# Patient Record
Sex: Female | Born: 1985 | Race: Black or African American | Hispanic: No | Marital: Married | Smoking: Never smoker
Health system: Southern US, Community
[De-identification: ages and names within clinical notes are randomized; demographics above are authoritative.]

## PROBLEM LIST (undated history)

## (undated) DIAGNOSIS — I10 Essential (primary) hypertension: Secondary | ICD-10-CM

## (undated) HISTORY — DX: Essential (primary) hypertension: I10

---

## 2008-04-21 ENCOUNTER — Other Ambulatory Visit: Admission: RE | Admit: 2008-04-21 | Discharge: 2008-04-21 | Payer: Self-pay | Admitting: Obstetrics and Gynecology

## 2008-12-19 ENCOUNTER — Inpatient Hospital Stay (HOSPITAL_COMMUNITY): Admission: AD | Admit: 2008-12-19 | Discharge: 2008-12-21 | Payer: Self-pay | Admitting: Obstetrics & Gynecology

## 2008-12-19 ENCOUNTER — Ambulatory Visit: Payer: Self-pay | Admitting: Obstetrics and Gynecology

## 2010-09-05 LAB — CBC
MCHC: 32.8 g/dL (ref 30.0–36.0)
MCV: 91.9 fL (ref 78.0–100.0)
RDW: 14.5 % (ref 11.5–15.5)

## 2010-09-05 LAB — RPR: RPR Ser Ql: NONREACTIVE

## 2012-07-16 ENCOUNTER — Encounter: Payer: Self-pay | Admitting: *Deleted

## 2012-08-30 ENCOUNTER — Other Ambulatory Visit: Payer: Self-pay | Admitting: *Deleted

## 2012-08-30 ENCOUNTER — Telehealth: Payer: Self-pay | Admitting: *Deleted

## 2012-08-30 ENCOUNTER — Encounter: Payer: Self-pay | Admitting: Obstetrics & Gynecology

## 2012-08-30 ENCOUNTER — Ambulatory Visit (INDEPENDENT_AMBULATORY_CARE_PROVIDER_SITE_OTHER): Payer: Medicaid Other | Admitting: Obstetrics & Gynecology

## 2012-08-30 NOTE — Patient Instructions (Signed)
Laparoscopic Tubal Ligation Laparoscopic tubal ligation is a procedure that closes the fallopian tubes at a time other than right after childbirth. By closing the fallopian tubes, the eggs that are released from the ovaries cannot enter the uterus and sperm cannot reach the egg. Tubal ligation is also known as getting your "tubes tied." Tubal ligation is done so you will not be able to get pregnant or have a baby.  Although this procedure may be reversed, it should be considered permanent and irreversible. If you want to have future pregnancies, you should not have this procedure.  LET YOUR CAREGIVER KNOW ABOUT:  Allergies to food or medicine.  Medicines taken, including vitamins, herbs, eyedrops, over-the-counter medicines, and creams.  Use of steroids (by mouth or creams).  Previous problems with numbing medicines.  History of bleeding problems or blood clots.  Any recent colds or infections.  Previous surgery.  Other health problems, including diabetes and kidney problems.  Possibility of pregnancy, if this applies.  Any past pregnancies. RISKS AND COMPLICATIONS   Infection.  Bleeding.  Injury to surrounding organs.  Anesthetic side effects.  Failure of the procedure.  Ectopic pregnancy.  Future regret about having the procedure done. BEFORE THE PROCEDURE  Do not take aspirin or blood thinners a week before the procedure or as directed. This can cause bleeding.  Do not eat or drink anything 6 to 8 hours before the procedure. PROCEDURE   You may be given a medicine to help you relax (sedative) before the procedure. You will be given a medicine to make you sleep (general anesthetic) during the procedure.  A tube will be put down your throat to help your breath while under general anesthesia.  Two small cuts (incisions) are made in the lower abdominal area and near the belly button.  Your abdominal area will be inflated with a safe gas (carbon dioxide). This helps  give the surgeon room to operate, visualize, and helps the surgeon avoid other organs.  A thin, lighted tube (laparoscope) with a camera attached is inserted into your abdomen through one of the incisions near the belly button. Other small instruments are also inserted through the other abdominal incision.  The fallopian tubes are located and are either blocked with a ring, clip, or are burned (cauterized).  After the fallopian tubes are blocked, the gas is released from the abdomen.  The incisions will be closed with stitches (sutures), and a bandage may be placed over the incisions. AFTER THE PROCEDURE   You will rest in a recovery room for 1 4 hours until you are stable and doing well.  You will also have some mild abdominal discomfort for 3 7 days. You will be given pain medicine to ease any discomfort.  As long as there are no problems, you may be allowed to go home. Someone will need to drive you home and be with you for at least 24 hours once home.  You may have some mild discomfort in the throat. This is from the tube placed in your throat while you were sleeping.  You may experience discomfort in the shoulder area from some trapped air between the liver and diaphragm. This sensation is normal and will slowly go away on its own. Document Released: 08/22/2000 Document Revised: 11/15/2011 Document Reviewed: 08/27/2011 [employer] Patient Information 2013 [employer].  

## 2012-08-30 NOTE — Progress Notes (Signed)
Patient ID: Terri Reynolds, female   DOB: 1985-09-26, 27 y.o.   MRN: [id number] Subjective:     Terri Reynolds is a 27 y.o. female who presents for a postpartum visit. She is 6 weeks postpartum following a spontaneous vaginal delivery. I have fully reviewed the prenatal and intrapartum course. The delivery was at 37 gestational weeks. Outcome: spontaneous vaginal delivery. Anesthesia: none. Postpartum course has been unremarkable. Baby's course has been unremarkable. Baby is feeding by bottle - [employer] Good Start. Bleeding no bleeding. Bowel function is normal. Bladder function is normal. Patient is not sexually active. Contraception method is desires  tubal ligation. Postpartum depression screening: negative.  The following portions of the patient's history were reviewed and updated as appropriate: allergies, current medications, past family history, past medical history, past social history, past surgical history and problem list.  Review of Systems Pertinent items are noted in HPI.   Objective:    BP 108/68  Ht 5\' 6"  (1.676 m)  Wt 256 lb (116.121 kg)  BMI 41.34 kg/m2  LMP 08/22/2012  General:  alert, cooperative and no distress   Breasts:  inspection negative, no nipple discharge or bleeding, no masses or nodularity palpable  Lungs: clear to auscultation bilaterally  Heart:  regular rate and rhythm, S1, S2 normal, no murmur, click, rub or gallop  Abdomen: soft, non-tender; bowel sounds normal; no masses,  no organomegaly   Vulva:  normal  Vagina: not evaluated  Cervix:  no lesions  Corpus: normal size, contour, position, consistency, mobility, non-tender  Adnexa:  normal adnexa  Rectal Exam: Normal rectovaginal exam        Assessment:     normal postpartum exam. Pap smear not done at today's visit.   Plan:    1. Contraception:  desires tubal ligation 2. btl 09/19/2012 3. Follow up in: 4 weeks or as needed.

## 2012-08-30 NOTE — Telephone Encounter (Signed)
Pt states returning Call regarding pre-op at [facility]. Informed pt of date and time of pre-op labs at [facility] ( 09/13/2012 @3 :15)

## 2012-08-31 ENCOUNTER — Encounter (HOSPITAL_COMMUNITY): Payer: Self-pay | Admitting: Pharmacy Technician

## 2012-09-12 ENCOUNTER — Telehealth: Payer: Self-pay | Admitting: Obstetrics & Gynecology

## 2012-09-12 NOTE — Patient Instructions (Addendum)
CAMIKA MARSICO  09/12/2012   Your procedure is scheduled on:   09/19/2012  Report to [facility] at  830  AM.  Call this number if you have problems the morning of surgery: [phone]   Remember:   Do not eat food or drink liquids after midnight.   Take these medicines the morning of surgery with A SIP OF WATER: none   Do not wear jewelry, make-up or nail polish.  Do not wear lotions, powders, or perfumes.   Do not shave 48 hours prior to surgery. Men may shave face and neck.  Do not bring valuables to the hospital.  Contacts, dentures or bridgework may not be worn into surgery.  Leave suitcase in the car. After surgery it may be brought to your room.  For patients admitted to the hospital, checkout time is 11:00 AM the day of discharge.   Patients discharged the day of surgery will not be allowed to drive  home.  Name and phone number of your driver: family  Special Instructions: Shower using CHG 2 nights before surgery and the night before surgery.  If you shower the day of surgery use CHG.  Use special wash - you have one bottle of CHG for all showers.  You should use approximately 1/3 of the bottle for each shower.   Please read over the following fact sheets that you were given: Pain Booklet, Coughing and Deep Breathing, MRSA Information, Surgical Site Infection Prevention, Anesthesia Post-op Instructions and Care and Recovery After Surgery Tubal Ligation Care After These instructions give you information on caring for yourself after your procedure. Your doctor may also give you more specific instructions. Call your doctor if you have any problems or questions after your procedure. HOME CARE   Do not drink alcohol for 24 hours.   Do not drive or use public transportation for 24 hours.   Do not sign important papers for 24 hours.   Have someone with you for 24 hours.   Keep eating and doing activities as normal.   Change any bandages (dressings) as told by your doctor.    Do not douche or have sex (intercourse) until your doctor says it is okay.  GET HELP RIGHT AWAY IF:   You have a rash.   You have trouble breathing.   You have a reaction or have side effects from your medicines.   You have redness, puffiness (swelling), or more pain in the wound area.   You have yellowish-white fluid (pus) coming from the wound or bandage.   You have a fever.   You have a bad smell coming from your wound or bandage.   Your wound opens up after your stiches (sutures) or staples are removed.   You have more belly (abdominal) pain.   You have a lot of bleeding from the vagina.   You have pain in your shoulders and chest.   You feel dizzy or pass out (faint).   You keep feeling sick to your stomach (nauseous) or throw up (vomit).  MAKE SURE YOU:   Understand these instructions.   Will watch this condition.   Will get help right away if you are not doing well or get worse.  Document Released: 02/23/2008 Document Revised: 05/05/2011 Document Reviewed: 02/23/2008 [facility] Patient Information 2012 [place], [place].PATIENT INSTRUCTIONS POST-ANESTHESIA  IMMEDIATELY FOLLOWING SURGERY:  Do not drive or operate machinery for the first twenty four hours after surgery.  Do not make any important decisions  for twenty four hours after surgery or while taking narcotic pain medications or sedatives.  If you develop intractable nausea and vomiting or a severe headache please notify your doctor immediately.  FOLLOW-UP:  Please make an appointment with your surgeon as instructed. You do not need to follow up with anesthesia unless specifically instructed to do so.  WOUND CARE INSTRUCTIONS (if applicable):  Keep a dry clean dressing on the anesthesia/puncture wound site if there is drainage.  Once the wound has quit draining you may leave it open to air.  Generally you should leave the bandage intact for twenty four hours unless there is drainage.  If the epidural site drains  for more than 36-48 hours please call the anesthesia department.  QUESTIONS?:  Please feel free to call your physician or the hospital operator if you have any questions, and they will be happy to assist you.

## 2012-09-12 NOTE — Telephone Encounter (Signed)
Pt informed will have postop appt with Dr. Despina Hidden after surgery to give clearance to return to work post surgery

## 2012-09-13 ENCOUNTER — Encounter (HOSPITAL_COMMUNITY): Payer: Self-pay

## 2012-09-13 ENCOUNTER — Encounter (HOSPITAL_COMMUNITY)
Admission: RE | Admit: 2012-09-13 | Discharge: 2012-09-13 | Disposition: A | Discharge status: Home / Self Care | Payer: Medicaid Other | Source: Ambulatory Visit | Attending: Obstetrics & Gynecology | Admitting: Obstetrics & Gynecology

## 2012-09-13 ENCOUNTER — Other Ambulatory Visit: Payer: Self-pay | Admitting: Obstetrics & Gynecology

## 2012-09-13 LAB — SURGICAL PCR SCREEN
MRSA, PCR: NEGATIVE
Staphylococcus aureus: NEGATIVE

## 2012-09-13 LAB — URINALYSIS, ROUTINE W REFLEX MICROSCOPIC
Glucose, UA: NEGATIVE mg/dL
Hgb urine dipstick: NEGATIVE
Leukocytes, UA: NEGATIVE
Protein, ur: NEGATIVE mg/dL
pH: 8 (ref 5.0–8.0)

## 2012-09-13 LAB — COMPREHENSIVE METABOLIC PANEL
AST: 13 U/L (ref 0–37)
Albumin: 3.3 g/dL — ABNORMAL LOW (ref 3.5–5.2)
Chloride: 103 mEq/L (ref 96–112)
Creatinine, Ser: 0.76 mg/dL (ref 0.50–1.10)
Potassium: 3.8 mEq/L (ref 3.5–5.1)
Total Bilirubin: 0.7 mg/dL (ref 0.3–1.2)
Total Protein: 7.1 g/dL (ref 6.0–8.3)

## 2012-09-13 LAB — CBC
MCHC: 32.1 g/dL (ref 30.0–36.0)
MCV: 86.1 fL (ref 78.0–100.0)
Platelets: 344 10*3/uL (ref 150–400)
RDW: 13.8 % (ref 11.5–15.5)
WBC: 7 10*3/uL (ref 4.0–10.5)

## 2012-09-13 LAB — HCG, QUANTITATIVE, PREGNANCY: hCG, Beta Chain, Quant, S: <1 m[IU]/mL (ref <5)

## 2012-09-19 ENCOUNTER — Ambulatory Visit (HOSPITAL_COMMUNITY)
Admission: RE | Admit: 2012-09-19 | Discharge: 2012-09-19 | Disposition: A | Discharge status: Home / Self Care | Payer: Medicaid Other | Source: Ambulatory Visit | Attending: Obstetrics & Gynecology | Admitting: Obstetrics & Gynecology

## 2012-09-19 ENCOUNTER — Encounter (HOSPITAL_COMMUNITY): Payer: Self-pay | Admitting: *Deleted

## 2012-09-19 ENCOUNTER — Encounter (HOSPITAL_COMMUNITY): Payer: Self-pay | Admitting: Anesthesiology

## 2012-09-19 ENCOUNTER — Ambulatory Visit (HOSPITAL_COMMUNITY): Payer: Medicaid Other | Admitting: Anesthesiology

## 2012-09-19 ENCOUNTER — Encounter (HOSPITAL_COMMUNITY)
Admission: RE | Disposition: A | Discharge status: Home / Self Care | Payer: Self-pay | Source: Ambulatory Visit | Attending: Obstetrics & Gynecology

## 2012-09-19 DIAGNOSIS — Z9889 Other specified postprocedural states: Secondary | ICD-10-CM

## 2012-09-19 DIAGNOSIS — Z302 Encounter for sterilization: Secondary | ICD-10-CM

## 2012-09-19 DIAGNOSIS — Z6841 Body Mass Index (BMI) 40.0 and over, adult: Secondary | ICD-10-CM | POA: Insufficient documentation

## 2012-09-19 HISTORY — PX: LAPAROSCOPIC TUBAL LIGATION: SHX1937

## 2012-09-19 SURGERY — LIGATION, FALLOPIAN TUBE, LAPAROSCOPIC
Anesthesia: General | Site: Abdomen | Laterality: Bilateral | Wound class: Clean

## 2012-09-19 MED ORDER — CEFAZOLIN SODIUM-DEXTROSE 2-3 GM-% IV SOLR: 2.0000 g | INTRAVENOUS | Status: DC

## 2012-09-19 MED ORDER — ROCURONIUM BROMIDE 50 MG/5ML IV SOLN
INTRAVENOUS | Status: AC
  Filled 2012-09-19: qty 1

## 2012-09-19 MED ORDER — ONDANSETRON HCL 4 MG/2ML IJ SOLN
4.0000 mg | Freq: Once | INTRAMUSCULAR | Status: AC
Start: 2012-09-19 — End: 2012-09-19
  Administered 2012-09-19: 4 mg via INTRAVENOUS

## 2012-09-19 MED ORDER — FENTANYL CITRATE 0.05 MG/ML IJ SOLN: 25.0000 ug | INTRAMUSCULAR | Status: DC | PRN

## 2012-09-19 MED ORDER — PROPOFOL 10 MG/ML IV EMUL
INTRAVENOUS | Status: DC | PRN
  Administered 2012-09-19: 150 mg via INTRAVENOUS

## 2012-09-19 MED ORDER — FENTANYL CITRATE 0.05 MG/ML IJ SOLN
INTRAMUSCULAR | Status: AC
  Filled 2012-09-19: qty 2

## 2012-09-19 MED ORDER — CEFAZOLIN SODIUM-DEXTROSE 2-3 GM-% IV SOLR
INTRAVENOUS | Status: DC | PRN
  Administered 2012-09-19: 2 g via INTRAVENOUS

## 2012-09-19 MED ORDER — 0.9 % SODIUM CHLORIDE (POUR BTL) OPTIME
TOPICAL | Status: DC | PRN
  Administered 2012-09-19: 1000 mL

## 2012-09-19 MED ORDER — LIDOCAINE HCL (CARDIAC) 10 MG/ML IV SOLN
INTRAVENOUS | Status: DC | PRN
  Administered 2012-09-19: 50 mg via INTRAVENOUS

## 2012-09-19 MED ORDER — ONDANSETRON HCL 4 MG/2ML IJ SOLN
INTRAMUSCULAR | Status: AC
  Filled 2012-09-19: qty 2

## 2012-09-19 MED ORDER — CEFAZOLIN SODIUM-DEXTROSE 2-3 GM-% IV SOLR
INTRAVENOUS | Status: AC
  Filled 2012-09-19: qty 50

## 2012-09-19 MED ORDER — LACTATED RINGERS IV SOLN
INTRAVENOUS | Status: DC
  Administered 2012-09-19: 1000 mL via INTRAVENOUS

## 2012-09-19 MED ORDER — ONDANSETRON HCL 8 MG PO TABS: 8.0000 mg | ORAL_TABLET | Freq: Three times a day (TID) | ORAL | Status: DC | PRN

## 2012-09-19 MED ORDER — ROCURONIUM BROMIDE 100 MG/10ML IV SOLN
INTRAVENOUS | Status: DC | PRN
  Administered 2012-09-19: 10 mg via INTRAVENOUS
  Administered 2012-09-19: 30 mg via INTRAVENOUS

## 2012-09-19 MED ORDER — MIDAZOLAM HCL 2 MG/2ML IJ SOLN
1.0000 mg | INTRAMUSCULAR | Status: DC | PRN
  Administered 2012-09-19 (×2): 2 mg via INTRAVENOUS

## 2012-09-19 MED ORDER — MIDAZOLAM HCL 2 MG/2ML IJ SOLN
INTRAMUSCULAR | Status: AC
  Filled 2012-09-19: qty 2

## 2012-09-19 MED ORDER — GLYCOPYRROLATE 0.2 MG/ML IJ SOLN
INTRAMUSCULAR | Status: DC | PRN
  Administered 2012-09-19: 0.6 mg via INTRAVENOUS

## 2012-09-19 MED ORDER — PROPOFOL 10 MG/ML IV EMUL
INTRAVENOUS | Status: AC
  Filled 2012-09-19: qty 20

## 2012-09-19 MED ORDER — KETOROLAC TROMETHAMINE 10 MG PO TABS: 10.0000 mg | ORAL_TABLET | Freq: Three times a day (TID) | ORAL | Status: DC | PRN

## 2012-09-19 MED ORDER — LACTATED RINGERS IV SOLN
INTRAVENOUS | Status: DC | PRN
  Administered 2012-09-19 (×2): via INTRAVENOUS

## 2012-09-19 MED ORDER — LIDOCAINE HCL (PF) 1 % IJ SOLN
INTRAMUSCULAR | Status: AC
  Filled 2012-09-19: qty 5

## 2012-09-19 MED ORDER — KETOROLAC TROMETHAMINE 30 MG/ML IJ SOLN
30.0000 mg | Freq: Once | INTRAMUSCULAR | Status: AC
  Administered 2012-09-19: 30 mg via INTRAVENOUS

## 2012-09-19 MED ORDER — ONDANSETRON HCL 4 MG/2ML IJ SOLN
4.0000 mg | Freq: Once | INTRAMUSCULAR | Status: AC | PRN
  Administered 2012-09-19: 4 mg via INTRAVENOUS

## 2012-09-19 MED ORDER — FENTANYL CITRATE 0.05 MG/ML IJ SOLN
INTRAMUSCULAR | Status: DC | PRN
  Administered 2012-09-19 (×2): 50 ug via INTRAVENOUS
  Administered 2012-09-19: 100 ug via INTRAVENOUS

## 2012-09-19 MED ORDER — NEOSTIGMINE METHYLSULFATE 1 MG/ML IJ SOLN
INTRAMUSCULAR | Status: DC | PRN
  Administered 2012-09-19: 1 mg via INTRAVENOUS
  Administered 2012-09-19: 4 mg via INTRAVENOUS
  Administered 2012-09-19: 1 mg via INTRAVENOUS

## 2012-09-19 MED ORDER — HYDROCODONE-ACETAMINOPHEN 5-325 MG PO TABS: 1.0000 | ORAL_TABLET | Freq: Four times a day (QID) | ORAL | Status: DC | PRN

## 2012-09-19 MED ORDER — KETOROLAC TROMETHAMINE 30 MG/ML IJ SOLN
INTRAMUSCULAR | Status: AC
  Filled 2012-09-19: qty 1

## 2012-09-19 SURGICAL SUPPLY — 32 items
APPLICATOR COTTON TIP 6IN STRL (MISCELLANEOUS) ×2 IMPLANT
BAG HAMPER (MISCELLANEOUS) ×2 IMPLANT
BLADE SURG SZ11 CARB STEEL (BLADE) ×2 IMPLANT
CLOTH BEACON ORANGE TIMEOUT ST (SAFETY) ×2 IMPLANT
COVER LIGHT HANDLE STERIS (MISCELLANEOUS) ×4 IMPLANT
ELECT REM PT RETURN 9FT ADLT (ELECTROSURGICAL) ×2
ELECTRODE REM PT RTRN 9FT ADLT (ELECTROSURGICAL) ×1 IMPLANT
GLOVE BIOGEL PI IND STRL 7.0 (GLOVE) ×1 IMPLANT
GLOVE BIOGEL PI IND STRL 7.5 (GLOVE) ×1 IMPLANT
GLOVE BIOGEL PI IND STRL 8 (GLOVE) ×1 IMPLANT
GLOVE BIOGEL PI INDICATOR 7.0 (GLOVE) ×1
GLOVE BIOGEL PI INDICATOR 7.5 (GLOVE) ×1
GLOVE BIOGEL PI INDICATOR 8 (GLOVE) ×1
GLOVE ECLIPSE 7.0 STRL STRAW (GLOVE) ×2 IMPLANT
GLOVE ECLIPSE 8.0 STRL XLNG CF (GLOVE) ×2 IMPLANT
GOWN STRL REIN XL XLG (GOWN DISPOSABLE) ×2 IMPLANT
INST SET LAPROSCOPIC GYN AP (KITS) ×2 IMPLANT
KIT ROOM TURNOVER AP CYSTO (KITS) ×2 IMPLANT
MANIFOLD NEPTUNE II (INSTRUMENTS) ×2 IMPLANT
NEEDLE INSUFFLATION 14GA 120MM (NEEDLE) ×2 IMPLANT
PACK PERI GYN (CUSTOM PROCEDURE TRAY) ×2 IMPLANT
PAD ARMBOARD 7.5X6 YLW CONV (MISCELLANEOUS) ×2 IMPLANT
SET BASIN LINEN APH (SET/KITS/TRAYS/PACK) ×2 IMPLANT
SOLUTION ANTI FOG 6CC (MISCELLANEOUS) ×2 IMPLANT
SPONGE GAUZE 2X2 8PLY STRL LF (GAUZE/BANDAGES/DRESSINGS) ×2 IMPLANT
STAPLER VISISTAT 35W (STAPLE) ×2 IMPLANT
SUT VICRYL 0 UR6 27IN ABS (SUTURE) ×2 IMPLANT
SYRINGE 10CC LL (SYRINGE) ×2 IMPLANT
TAPE CLOTH SURG 4X10 WHT LF (GAUZE/BANDAGES/DRESSINGS) ×2 IMPLANT
TROCAR Z-THRD FIOS HNDL 11X100 (TROCAR) ×2 IMPLANT
TUBING INSUFFLATION HIGH FLOW (TUBING) ×2 IMPLANT
WARMER LAPAROSCOPE (MISCELLANEOUS) ×2 IMPLANT

## 2012-09-19 NOTE — Anesthesia Postprocedure Evaluation (Signed)
  Anesthesia Post-op Note  Patient: Terri Reynolds  Procedure(s) Performed: Procedure(s): LAPAROSCOPIC TUBAL LIGATION (Bilateral)  Patient Location: PACU  Anesthesia Type:General  Level of Consciousness: awake, alert , oriented and patient cooperative  Airway and Oxygen Therapy: Patient Spontanous Breathing and Patient connected to face mask oxygen  Post-op Pain: mild  Post-op Assessment: Post-op Vital signs reviewed, Patient's Cardiovascular Status Stable, Respiratory Function Stable, Patent Airway and Pain level controlled  Post-op Vital Signs: Reviewed and stable  Complications: No apparent anesthesia complications

## 2012-09-19 NOTE — Transfer of Care (Signed)
Immediate Anesthesia Transfer of Care Note  Patient: Terri Reynolds  Procedure(s) Performed: Procedure(s): LAPAROSCOPIC TUBAL LIGATION (Bilateral)  Patient Location: PACU  Anesthesia Type:General  Level of Consciousness: awake, alert , oriented and patient cooperative  Airway & Oxygen Therapy: Patient Spontanous Breathing and Patient connected to face mask oxygen  Post-op Assessment: Report given to PACU RN, Post -op Vital signs reviewed and stable and Patient moving all extremities  Post vital signs: Reviewed and stable  Complications: No apparent anesthesia complications

## 2012-09-19 NOTE — Anesthesia Preprocedure Evaluation (Signed)
Anesthesia Evaluation  Patient identified by MRN, date of birth, ID band Patient awake    Reviewed: Allergy & Precautions, H&P , NPO status , Patient's Chart, lab work & pertinent test results  Airway Mallampati: I TM Distance: >3 FB     Dental  (+) Teeth Intact   Pulmonary neg pulmonary ROS,  breath sounds clear to auscultation        Cardiovascular negative cardio ROS  Rhythm:Regular     Neuro/Psych    GI/Hepatic negative GI ROS,   Endo/Other  Morbid obesity  Renal/GU      Musculoskeletal   Abdominal   Peds  Hematology   Anesthesia Other Findings   Reproductive/Obstetrics                           Anesthesia Physical Anesthesia Plan  ASA: II  Anesthesia Plan: General   Post-op Pain Management:    Induction: Intravenous  Airway Management Planned: Oral ETT  Additional Equipment:   Intra-op Plan:   Post-operative Plan: Extubation in OR  Informed Consent: I have reviewed the patients History and Physical, chart, labs and discussed the procedure including the risks, benefits and alternatives for the proposed anesthesia with the patient or authorized representative who has indicated his/her understanding and acceptance.     Plan Discussed with:   Anesthesia Plan Comments:         Anesthesia Quick Evaluation

## 2012-09-19 NOTE — Op Note (Signed)
Preoperative Diagnosis:  Multiparous female desires permanent sterilization  Postoperative Diagnosis:  Same as above  Procedure:  Laparoscopic Bilateral Tubal Ligation  Surgeon:  Rockne Coons MD  Anaesthesia:  LMA  Findings:  Patient had normal pelvic anatomy and no intraperitoneal abnormalities.  Description of Operation:  Patient was taken to the OR and placed into supine position where she underwent LMA anaesthesia.  She was placed in the dorsal lithotomy position and prepped and draped in the usual sterile fashion.  An incision was made in the umbilicus and dissection taken down to the rectus fascia which was incised and opened.  The non bladed trocar was then placed and the peritoneal cavity was insufflated.  The above noted findings were observed.  The Kleppinger electrocautery unit was employed and both tubes were burned to no resistance and beyond in the distal ishtmic and ampullary regions, approximately a 3.5 cm segment bilaterally.  There was good hemostasis.  The fascia, peritoneum and subcutaneous tissue were closed using 0 vicryl.  The skin was closed using staples.  The patient was awakened from anaesthesia and taken to the PACU with all counts being correct x 3.  The patient received Ancef 1 gram and Toradol 30 mg IV preoperatively.  EURE,LUTHER H 09/19/2012 11:35 AM

## 2012-09-19 NOTE — Progress Notes (Signed)
Awake. Eyeglasses to pt.

## 2012-09-19 NOTE — Anesthesia Procedure Notes (Signed)
Procedure Name: Intubation Date/Time: 09/19/2012 11:01 AM Performed by: Carolyne Littles, AMY L Pre-anesthesia Checklist: Patient identified, Patient being monitored, Timeout performed, Emergency Drugs available and Suction available Patient Re-evaluated:Patient Re-evaluated prior to inductionOxygen Delivery Method: Circle System Utilized Preoxygenation: Pre-oxygenation with 100% oxygen Intubation Type: IV induction Ventilation: Mask ventilation without difficulty Laryngoscope Size: 3 and Miller Grade View: Grade I Tube type: Oral Tube size: 7.0 mm Number of attempts: 1 Airway Equipment and Method: stylet Placement Confirmation: ETT inserted through vocal cords under direct vision,  positive ETCO2 and breath sounds checked- equal and bilateral Secured at: 21 cm Tube secured with: Tape Dental Injury: Teeth and Oropharynx as per pre-operative assessment

## 2012-09-19 NOTE — H&P (Signed)
Preoperative History and Physical  Terri Reynolds is a 27 y.o. G2P0 with Patient's last menstrual period was 09/15/2012. admitted for a laparoscopic bilateral tubal ligation.  Patient understands the permanent nature of the procedure  PMH:   History reviewed. No pertinent past medical history.  PSH:    History reviewed. No pertinent past surgical history.  POb/GynH:     G2P2 OB History   Grav Para Term Preterm Abortions TAB SAB Ect Mult Living   2               SH:   History  Substance Use Topics  . Smoking status: Never Smoker   . Smokeless tobacco: Not on file  . Alcohol Use: Yes     Comment: occasional drink once a month    FH:    Family History  Problem Relation Age of Onset  . Diabetes Maternal Grandmother      Allergies: No Known Allergies  Medications:      Current facility-administered medications:ceFAZolin (ANCEF) IVPB 2 g/50 mL premix, 2 g, Intravenous, On Call to OR, Lazaro Arms, MD;  lactated ringers infusion, , Intravenous, Continuous, Laurene Footman, MD, Last Rate: 75 mL/hr at 09/19/12 1019, 1,000 mL at 09/19/12 1019;  midazolam (VERSED) injection 1-2 mg, 1-2 mg, Intravenous, Q5 Min x 3 PRN, Laurene Footman, MD, 2 mg at 09/19/12 1031 Facility-Administered Medications Ordered in Other Encounters: lactated ringers infusion, , , Continuous PRN, Amy L Andraza, CRNA  Review of Systems:   Review of Systems  Constitutional: Negative for fever, chills, weight loss, malaise/fatigue and diaphoresis.  HENT: Negative for hearing loss, ear pain, nosebleeds, congestion, sore throat, neck pain, tinnitus and ear discharge.   Eyes: Negative for blurred vision, double vision, photophobia, pain, discharge and redness.  Respiratory: Negative for cough, hemoptysis, sputum production, shortness of breath, wheezing and stridor.   Cardiovascular: Negative for chest pain, palpitations, orthopnea, claudication, leg swelling and PND.  Gastrointestinal: Negative for abdominal pain.  Negative for heartburn, nausea, vomiting, diarrhea, constipation, blood in stool and melena.  Genitourinary: Negative for dysuria, urgency, frequency, hematuria and flank pain.  Musculoskeletal: Negative for myalgias, back pain, joint pain and falls.  Skin: Negative for itching and rash.  Neurological: Negative for dizziness, tingling, tremors, sensory change, speech change, focal weakness, seizures, loss of consciousness, weakness and headaches.  Endo/Heme/Allergies: Negative for environmental allergies and polydipsia. Does not bruise/bleed easily.  Psychiatric/Behavioral: Negative for depression, suicidal ideas, hallucinations, memory loss and substance abuse. The patient is not nervous/anxious and does not have insomnia.      PHYSICAL EXAM:  Blood pressure 123/80, pulse 60, temperature 97.8 F (36.6 C), temperature source Oral, resp. rate 21, height 5\' 6"  (1.676 m), weight 258 lb (117.028 kg), last menstrual period 09/15/2012, SpO2 99.00%.    Vitals reviewed. Constitutional: She is oriented to person, place, and time. She appears well-developed and well-nourished.  HENT:  Head: Normocephalic and atraumatic.  Right Ear: External ear normal.  Left Ear: External ear normal.  Nose: Nose normal.  Mouth/Throat: Oropharynx is clear and moist.  Eyes: Conjunctivae and EOM are normal. Pupils are equal, round, and reactive to light. Right eye exhibits no discharge. Left eye exhibits no discharge. No scleral icterus.  Neck: Normal range of motion. Neck supple. No tracheal deviation present. No thyromegaly present.  Cardiovascular: Normal rate, regular rhythm, normal heart sounds and intact distal pulses.  Exam reveals no gallop and no friction rub.   No murmur heard. Respiratory: Effort normal and breath sounds normal.  No respiratory distress. She has no wheezes. She has no rales. She exhibits no tenderness.  GI: Soft. Bowel sounds are normal. She exhibits no distension and no mass. There is  tenderness. There is no rebound and no guarding.  Genitourinary:       Vulva is normal without lesions Vagina is pink moist without discharge Cervix normal in appearance and pap is normal Uterus is normal Adnexa is negative with normal sized ovaries by sonogram  Musculoskeletal: Normal range of motion. She exhibits no edema and no tenderness.  Neurological: She is alert and oriented to person, place, and time. She has normal reflexes. She displays normal reflexes. No cranial nerve deficit. She exhibits normal muscle tone. Coordination normal.  Skin: Skin is warm and dry. No rash noted. No erythema. No pallor.  Psychiatric: She has a normal mood and affect. Her behavior is normal. Judgment and thought content normal.    Labs: Results for orders placed during the hospital encounter of 09/13/12 (from the past 336 hour(s))  SURGICAL PCR SCREEN   Collection Time    09/13/12  8:45 AM      Result Value Range   MRSA, PCR NEGATIVE  NEGATIVE   Staphylococcus aureus NEGATIVE  NEGATIVE  CBC   Collection Time    09/13/12  8:45 AM      Result Value Range   WBC 7.0  4.0 - 10.5 K/uL   RBC 4.31  3.87 - 5.11 MIL/uL   Hemoglobin 11.9 (*) 12.0 - 15.0 g/dL   HCT 78.4  69.6 - 29.5 %   MCV 86.1  78.0 - 100.0 fL   MCH 27.6  26.0 - 34.0 pg   MCHC 32.1  30.0 - 36.0 g/dL   RDW 28.4  13.2 - 44.0 %   Platelets 344  150 - 400 K/uL  COMPREHENSIVE METABOLIC PANEL   Collection Time    09/13/12  8:45 AM      Result Value Range   Sodium 135  135 - 145 mEq/L   Potassium 3.8  3.5 - 5.1 mEq/L   Chloride 103  96 - 112 mEq/L   CO2 22  19 - 32 mEq/L   Glucose, Bld 84  70 - 99 mg/dL   BUN 5 (*) 6 - 23 mg/dL   Creatinine, Ser 1.02  0.50 - 1.10 mg/dL   Calcium 8.9  8.4 - 72.5 mg/dL   Total Protein 7.1  6.0 - 8.3 g/dL   Albumin 3.3 (*) 3.5 - 5.2 g/dL   AST 13  0 - 37 U/L   ALT 8  0 - 35 U/L   Alkaline Phosphatase 80  39 - 117 U/L   Total Bilirubin 0.7  0.3 - 1.2 mg/dL   GFR calc non Af Amer >90  >90 mL/min    GFR calc Af Amer >90  >90 mL/min  URINALYSIS, ROUTINE W REFLEX MICROSCOPIC   Collection Time    09/13/12  8:45 AM      Result Value Range   Color, Urine YELLOW  YELLOW   APPearance CLEAR  CLEAR   Specific Gravity, Urine 1.020  1.005 - 1.030   pH 8.0  5.0 - 8.0   Glucose, UA NEGATIVE  NEGATIVE mg/dL   Hgb urine dipstick NEGATIVE  NEGATIVE   Bilirubin Urine NEGATIVE  NEGATIVE   Ketones, ur NEGATIVE  NEGATIVE mg/dL   Protein, ur NEGATIVE  NEGATIVE mg/dL   Urobilinogen, UA 0.2  0.0 - 1.0 mg/dL   Nitrite NEGATIVE  NEGATIVE  Leukocytes, UA NEGATIVE  NEGATIVE  HCG, QUANTITATIVE, PREGNANCY   Collection Time    09/13/12  8:45 AM      Result Value Range   hCG, Beta Chain, Quant, S <1  <5 mIU/mL    EKG:   Imaging Studies:     Assessment: Desires permanent sterilization  Plan: Laparoscopic bilateral tubal ligation  Island Dohmen H 09/19/2012 10:45 AM

## 2012-09-21 ENCOUNTER — Encounter (HOSPITAL_COMMUNITY): Payer: Self-pay | Admitting: Obstetrics & Gynecology

## 2012-10-01 ENCOUNTER — Encounter: Payer: Medicaid Other | Admitting: Obstetrics & Gynecology

## 2012-10-02 ENCOUNTER — Ambulatory Visit (INDEPENDENT_AMBULATORY_CARE_PROVIDER_SITE_OTHER): Payer: Self-pay | Admitting: Obstetrics & Gynecology

## 2012-10-02 ENCOUNTER — Encounter: Payer: Self-pay | Admitting: Obstetrics & Gynecology

## 2012-10-02 VITALS — BP 112/74 | Ht 67.0 in | Wt 260.0 lb

## 2012-10-02 DIAGNOSIS — Z9889 Other specified postprocedural states: Secondary | ICD-10-CM

## 2012-10-03 NOTE — Progress Notes (Signed)
Patient ID: Terri Reynolds, female   DOB: 1985-10-10, 27 y.o.   MRN: [id number] Post operative lap btl No complaints Incision clean dry intact Staples removed  Follow up prn or for yearly

## 2014-03-31 ENCOUNTER — Encounter: Payer: Self-pay | Admitting: Obstetrics & Gynecology

## 2014-06-04 ENCOUNTER — Ambulatory Visit: Payer: Self-pay | Admitting: Adult Health

## 2014-06-11 ENCOUNTER — Ambulatory Visit (INDEPENDENT_AMBULATORY_CARE_PROVIDER_SITE_OTHER): Payer: Self-pay | Admitting: Advanced Practice Midwife

## 2014-06-11 ENCOUNTER — Encounter: Payer: Self-pay | Admitting: Advanced Practice Midwife

## 2014-06-11 ENCOUNTER — Other Ambulatory Visit (HOSPITAL_COMMUNITY)
Admission: RE | Admit: 2014-06-11 | Discharge: 2014-06-11 | Disposition: A | Discharge status: Home / Self Care | Payer: Medicaid Other | Source: Ambulatory Visit | Attending: Obstetrics and Gynecology | Admitting: Obstetrics and Gynecology

## 2014-06-11 VITALS — BP 118/70 | Ht 67.0 in | Wt 277.0 lb

## 2014-06-11 DIAGNOSIS — Z01419 Encounter for gynecological examination (general) (routine) without abnormal findings: Secondary | ICD-10-CM | POA: Insufficient documentation

## 2014-06-11 DIAGNOSIS — Z6841 Body Mass Index (BMI) 40.0 and over, adult: Secondary | ICD-10-CM

## 2014-06-11 NOTE — Progress Notes (Signed)
Terri Reynolds 29 y.o.  Filed Vitals:   06/11/14 1110  BP: 118/70     Past Medical History: History reviewed. No pertinent past medical history.  Past Surgical History: Past Surgical History  Procedure Laterality Date  . Laparoscopic tubal ligation Bilateral 09/19/2012    Procedure: LAPAROSCOPIC TUBAL LIGATION;  Surgeon: Lazaro ArmsLuther H Eure, MD;  Location: [facility];  Service: Gynecology;  Laterality: Bilateral;    Family History: Family History  Problem Relation Age of Onset  . Diabetes Maternal Grandmother   . Hypertension Brother     Social History: History  Substance Use Topics  . Smoking status: Never Smoker   . Smokeless tobacco: Not on file  . Alcohol Use: Yes     Comment: occasional drink once a month    Allergies: No Known Allergies    Current outpatient prescriptions:  .  amoxicillin (AMOXIL) 250 MG capsule, Take 250 mg by mouth 2 (two) times daily., Disp: , Rfl:   History of Present Illness: Here for pap/physical.  Had BTL.  On last few days of amoxicillin for strep throat.  No C/O   Review of Systems   Patient denies any headaches, blurred vision, shortness of breath, chest pain, abdominal pain, problems with bowel movements, urination, or intercourse.   Physical Exam: General:  Well developed, well nourished, no acute distress Skin:  Warm and dry Neck:  Midline trachea, normal thyroid Lungs; Clear to auscultation bilaterally Breast:  No dominant palpable mass, retraction, or nipple discharge Cardiovascular: Regular rate and rhythm Abdomen:  Soft, non tender, no hepatosplenomegaly Pelvic:  External genitalia is normal in appearance.  The vagina is normal in appearance.  The cervix is bulbous.  Uterus is felt to be normal size, shape, and contour, although exam limited by body habitus  No adnexal masses or tenderness noted Extremities:  No swelling or varicosities noted Psych:  No mood changes.     Impression: Normal GYN exam     Plan: CBC, CMP,  TSH, LIPID PANEL when fasting next week

## 2014-06-11 NOTE — Addendum Note (Signed)
Addended by: Criss AlvinePULLIAM, Juanpablo Ciresi G on: 06/11/2014 01:42 PM   Modules accepted: Orders

## 2014-06-13 LAB — CYTOLOGY - PAP

## 2014-06-19 ENCOUNTER — Other Ambulatory Visit: Payer: Self-pay

## 2014-06-19 ENCOUNTER — Encounter: Payer: Self-pay | Admitting: Advanced Practice Midwife

## 2014-06-19 ENCOUNTER — Ambulatory Visit (INDEPENDENT_AMBULATORY_CARE_PROVIDER_SITE_OTHER): Payer: Self-pay | Admitting: Advanced Practice Midwife

## 2014-06-19 VITALS — BP 110/70 | Wt 278.0 lb

## 2014-06-19 DIAGNOSIS — Z13220 Encounter for screening for lipoid disorders: Secondary | ICD-10-CM

## 2014-06-19 DIAGNOSIS — Z1329 Encounter for screening for other suspected endocrine disorder: Secondary | ICD-10-CM

## 2014-06-19 DIAGNOSIS — B372 Candidiasis of skin and nail: Secondary | ICD-10-CM

## 2014-06-19 DIAGNOSIS — Z01419 Encounter for gynecological examination (general) (routine) without abnormal findings: Secondary | ICD-10-CM

## 2014-06-19 LAB — CBC
HCT: 34 % — ABNORMAL LOW (ref 36.0–46.0)
HEMOGLOBIN: 10.8 g/dL — AB (ref 12.0–15.0)
MCH: 26.1 pg (ref 26.0–34.0)
MCHC: 31.8 g/dL (ref 30.0–36.0)
MCV: 82.1 fL (ref 78.0–100.0)
MPV: 9.3 fL (ref 8.6–12.4)
Platelets: 390 10*3/uL (ref 150–400)
RBC: 4.14 MIL/uL (ref 3.87–5.11)
RDW: 15.6 % — AB (ref 11.5–15.5)
WBC: 8.4 10*3/uL (ref 4.0–10.5)

## 2014-06-19 LAB — COMPREHENSIVE METABOLIC PANEL
ALT: 8 U/L (ref 0–35)
AST: 12 U/L (ref 0–37)
Albumin: 3.4 g/dL — ABNORMAL LOW (ref 3.5–5.2)
Alkaline Phosphatase: 69 U/L (ref 39–117)
BUN: 4 mg/dL — ABNORMAL LOW (ref 6–23)
CO2: 26 mEq/L (ref 19–32)
CREATININE: 0.7 mg/dL (ref 0.50–1.10)
Calcium: 8.6 mg/dL (ref 8.4–10.5)
Chloride: 106 mEq/L (ref 96–112)
Glucose, Bld: 79 mg/dL (ref 70–99)
Potassium: 4.1 mEq/L (ref 3.5–5.3)
SODIUM: 138 meq/L (ref 135–145)
Total Bilirubin: 0.8 mg/dL (ref 0.2–1.2)
Total Protein: 6.5 g/dL (ref 6.0–8.3)

## 2014-06-19 LAB — LIPID PANEL
CHOL/HDL RATIO: 2.5 ratio
CHOLESTEROL: 137 mg/dL (ref 0–200)
HDL: 55 mg/dL (ref >39)
LDL Cholesterol: 67 mg/dL (ref 0–99)
TRIGLYCERIDES: 74 mg/dL (ref <150)
VLDL: 15 mg/dL (ref 0–40)

## 2014-06-19 LAB — TSH: TSH: 1.241 u[IU]/mL (ref 0.350–4.500)

## 2014-06-19 MED ORDER — FLUCONAZOLE 150 MG PO TABS: ORAL_TABLET | ORAL | Status: DC

## 2014-06-19 NOTE — Progress Notes (Signed)
[facility] Visit  Patient name: Terri JulyWhitney N Belson MRN [id number]  Date of birth: [birth year]  CC & HPI:  Terri Reynolds is a 29 y.o. African American female presenting today for c/o itchy groin rash.  Had been on several abx.  Also here for screening labs  Pertinent History Reviewed:  Medical & Surgical Hx:   History reviewed. No pertinent past medical history. Past Surgical History  Procedure Laterality Date  . Laparoscopic tubal ligation Bilateral 09/19/2012    Procedure: LAPAROSCOPIC TUBAL LIGATION;  Surgeon: Lazaro ArmsLuther H Eure, MD;  Location: [facility];  Service: Gynecology;  Laterality: Bilateral;    Current outpatient prescriptions:  .  amoxicillin (AMOXIL) 250 MG capsule, Take 250 mg by mouth 2 (two) times daily., Disp: , Rfl:  .  fluconazole (DIFLUCAN) 150 MG tablet, 1 po stat; repeat in 3 days, Disp: 2 tablet, Rfl: 2 Social History: Reviewed -  reports that she has never smoked. She does not have any smokeless tobacco history on file.  Objective Findings:  Vitals: BP 110/70 mmHg  Wt 278 lb (126.1 kg)  LMP 06/03/2014  Physical Examination: General appearance - alert, well appearing, and in no distress Mental status - alert, oriented to person, place, and time Skin - yeast rash on groin.  SSE:  Normal discharge  No results found for this or any previous visit (from the past 24 hour(s)).   Assessment & Plan:  A:   Skin yeast P:  Diflucan  May also use OTC cream CBC, CMP, TSH, Lipid profile   CRESENZO-DISHMAN,Cyndra Feinberg CNM 06/19/2014 9:47 AM

## 2014-12-17 ENCOUNTER — Ambulatory Visit (INDEPENDENT_AMBULATORY_CARE_PROVIDER_SITE_OTHER): Payer: Self-pay | Admitting: Advanced Practice Midwife

## 2014-12-17 ENCOUNTER — Encounter: Payer: Self-pay | Admitting: Advanced Practice Midwife

## 2014-12-17 VITALS — BP 98/58 | HR 64 | Ht 66.0 in | Wt 271.0 lb

## 2014-12-17 DIAGNOSIS — N611 Abscess of the breast and nipple: Secondary | ICD-10-CM

## 2014-12-17 DIAGNOSIS — N61 Inflammatory disorders of breast: Secondary | ICD-10-CM

## 2014-12-17 NOTE — Progress Notes (Signed)
   [facility] Visit  Patient name: Terri Reynolds MRN [id number]  Date of birth: 1986-04-29  CC & HPI:  Terri Reynolds is a 29 y.o. African American female presenting today for c/o lump in right breast.  Noticed it Monday while in the shower.  Was tender to touch on Monday, now it is not. No drainage noted  Pertinent History Reviewed:  Medical & Surgical Hx:   History reviewed. No pertinent past medical history. Past Surgical History  Procedure Laterality Date  . Laparoscopic tubal ligation Bilateral 09/19/2012    Procedure: LAPAROSCOPIC TUBAL LIGATION;  Surgeon: Lazaro ArmsLuther H Eure, MD;  Location: [facility];  Service: Gynecology;  Laterality: Bilateral;   Family History  Problem Relation Age of Onset  . Diabetes Maternal Grandmother   . Hypertension Brother     Current outpatient prescriptions:  .  amoxicillin (AMOXIL) 250 MG capsule, Take 250 mg by mouth 2 (two) times daily., Disp: , Rfl:  .  fluconazole (DIFLUCAN) 150 MG tablet, 1 po stat; repeat in 3 days (Patient not taking: Reported on 12/17/2014), Disp: 2 tablet, Rfl: 2 Social History: Reviewed -  reports that she has never smoked. She has never used smokeless tobacco.  Review of Systems:   Constitutional: Negative for fever and chills Eyes: Negative for visual disturbances Respiratory: Negative for shortness of breath, dyspnea Cardiovascular: Negative for chest pain or palpitations  Gastrointestinal: Negative for vomiting, diarrhea and constipation; no abdominal pain Genitourinary: Negative for dysuria and urgency, vaginal irritation or itching Musculoskeletal: Negative for back pain, joint pain, myalgias  Neurological: Negative for dizziness and headaches    Objective Findings:  Vitals: BP 98/58 mmHg  Pulse 64  Ht 5\' 6"  (1.676 m)  Wt 271 lb (122.925 kg)  BMI 43.76 kg/m2  LMP 12/16/2014  Physical Examination: General appearance - alert, well appearing, and in no distress Mental status - alert, oriented to  person, place, and time Breasts - 1/2 cm furuncle right breast near chest wall.  Sl discolored but not erythemous.  Seems to be absorbing rather than coming to a head.       Assessment & Plan:  A:   Furuncle, resolving P:  No tx needed  CRESENZO-DISHMAN,Mohit Zirbes CNM 12/17/2014 3:51 PM

## 2014-12-17 NOTE — Patient Instructions (Signed)
Furuncle

## 2015-01-26 ENCOUNTER — Encounter: Payer: Self-pay | Admitting: Women's Health

## 2015-01-26 ENCOUNTER — Ambulatory Visit (INDEPENDENT_AMBULATORY_CARE_PROVIDER_SITE_OTHER): Payer: Self-pay | Admitting: Women's Health

## 2015-01-26 VITALS — BP 124/76 | HR 80 | Wt 272.0 lb

## 2015-01-26 DIAGNOSIS — N939 Abnormal uterine and vaginal bleeding, unspecified: Secondary | ICD-10-CM

## 2015-01-26 DIAGNOSIS — N949 Unspecified condition associated with female genital organs and menstrual cycle: Secondary | ICD-10-CM

## 2015-01-26 DIAGNOSIS — N923 Ovulation bleeding: Secondary | ICD-10-CM

## 2015-01-26 DIAGNOSIS — R102 Pelvic and perineal pain: Secondary | ICD-10-CM

## 2015-01-26 LAB — POCT WET PREP (WET MOUNT): CLUE CELLS WET PREP WHIFF POC: NEGATIVE

## 2015-01-26 NOTE — Progress Notes (Signed)
   [facility] Visit  Patient name: Terri Reynolds MRN [id number]  Date of birth: [birth year]  CC & HPI:  Terri Reynolds is a 29 y.o. G2P0 African American female presenting today for report of intermenstrual spotting x 1 and abdominal cramping last week. Both have resolved, she just wanted to make sure everything is ok. Denies abnormal d/c, itching/odor/irritation. Is sexually active. Denies dyspareunia. Up to date on pap.  Patient's last menstrual period was 01/10/2015. The current method of family planning is tubal ligation.  Pertinent History Reviewed:  Medical & Surgical Hx:   History reviewed. No pertinent past medical history. Past Surgical History  Procedure Laterality Date  . Laparoscopic tubal ligation Bilateral 09/19/2012    Procedure: LAPAROSCOPIC TUBAL LIGATION;  Surgeon: Lazaro Arms, MD;  Location: [facility];  Service: Gynecology;  Laterality: Bilateral;   Medications: Reviewed & Updated - see associated section Social History: Reviewed -  reports that she has never smoked. She has never used smokeless tobacco.  Objective Findings:  Vitals: BP 124/76 mmHg  Pulse 80  Wt 272 lb (123.378 kg)  LMP 01/10/2015 Body mass index is 43.92 kg/(m^2).  Physical Examination: General appearance - alert, well appearing, and in no distress Pelvic - VULVA: normal appearing vulva with no masses, tenderness or lesions, VAGINA: normal appearing vagina with normal color and discharge, no lesions, CERVIX: normal appearing cervix without discharge or lesions or polyps, WET MOUNT done - results: negative for pathogens, normal epithelial cells  Results for orders placed or performed in visit on 01/26/15 (from the past 24 hour(s))  POCT Wet Prep Mellody Drown [place])   Collection Time: 01/26/15  4:50 PM  Result Value Ref Range   Source Wet Prep POC vaginal    WBC, Wet Prep HPF POC none    Bacteria Wet Prep HPF POC None None, Few   BACTERIA WET PREP MORPHOLOGY POC     Clue Cells Wet Prep HPF  POC None None   Clue Cells Wet Prep Whiff POC Negative Whiff    Yeast Wet Prep HPF POC None    KOH Wet Prep POC     Trichomonas Wet Prep HPF POC none      Assessment & Plan:  A:   Intermenstrual spotting  Cramping  P:  GC/CT today  Return for prn.  Marge Duncans CNM, [facility] 01/26/2015 4:51 PM

## 2015-01-26 NOTE — Progress Notes (Deleted)
   [facility] Visit  Patient name: Terri Reynolds MRN [id number]  Date of birth: 12-09-1985  CC & HPI:  Terri Reynolds is a 29 y.o. {Race/ethnicity:17218} female presenting today for ***  Pertinent History Reviewed:  Medical & Surgical Hx:   History reviewed. No pertinent past medical history. Past Surgical History  Procedure Laterality Date  . Laparoscopic tubal ligation Bilateral 09/19/2012    Procedure: LAPAROSCOPIC TUBAL LIGATION;  Surgeon: Lazaro Arms, MD;  Location: [facility];  Service: Gynecology;  Laterality: Bilateral;   Medications: Reviewed & Updated - see associated section Social History: Reviewed -  reports that she has never smoked. She has never used smokeless tobacco.  Objective Findings:  Vitals: BP 124/76 mmHg  Pulse 80  Wt 272 lb (123.378 kg)  LMP 01/10/2015  Physical Examination: {female adult master:310786}  No results found for this or any previous visit (from the past 24 hour(s)).   Assessment & Plan:  A:   *** P:  ***   F/U *** for ***   Marge Duncans CNM, [facility] 01/26/2015 4:30 PM      Chief Complaint  Patient presents with  . Abdominal Pain    cramping between periods, slight pink color when wipes    Blood pressure 124/76, pulse 80, weight 272 lb (123.378 kg), last menstrual period 01/10/2015.  29 y.o. G2P0 Patient's last menstrual period was 01/10/2015. The current method of family planning is {contraception:315051}.  Subjective ***  Objective Vulva:  {external genitalia female:315901::"normal appearing vulva with no masses, tenderness or lesions"} Vagina:  {Exam; WUJWJX:91478} Cervix:  {exam; cervix:14595} Uterus:  {exam; uterus:12215} Adnexa: ovaries:{DESC; PRESENT/NOT PRESENT:21021351},  {adnexa:315906::"normal adnexa in size, nontender and no masses"}   Pertinent ROS No burning with urination, frequency or urgency No nausea, vomiting or diarrhea Nor fever chills or other constitutional  symptoms ***  Labs or studies ***    Impression Diagnoses this Encounter::   ICD-9-CM ICD-10-CM   1. Intermenstrual spotting 626.6 N93.9 GC/Chlamydia Probe Amp    Established relevant diagnosis(es): ***  Plan/Recommendations Meds ordered this encounter  Medications  . ibuprofen (ADVIL,MOTRIN) 200 MG tablet    Sig: Take 200 mg by mouth every 6 (six) hours as needed.    Orders Placed This Encounter  Procedures  . GC/Chlamydia Probe Amp    ***  Follow up Return for prn.   {99212: 10 min     99213: 15 min      99214:25 min}   Face to face time:  *** minutes  Greater than 50% of the visit time was spent in counseling and coordination of care with the patient.  The summary and outline of the counseling and care coordination is summarized in the note above.   All questions were answered.

## 2015-01-27 LAB — GC/CHLAMYDIA PROBE AMP
Chlamydia trachomatis, NAA: NEGATIVE
Neisseria gonorrhoeae by PCR: NEGATIVE

## 2016-01-12 ENCOUNTER — Ambulatory Visit (INDEPENDENT_AMBULATORY_CARE_PROVIDER_SITE_OTHER): Payer: PRIVATE HEALTH INSURANCE | Admitting: Advanced Practice Midwife

## 2016-01-12 ENCOUNTER — Encounter: Payer: Self-pay | Admitting: Advanced Practice Midwife

## 2016-01-12 VITALS — BP 118/78 | HR 80 | Ht 65.0 in | Wt 284.0 lb

## 2016-01-12 DIAGNOSIS — N898 Other specified noninflammatory disorders of vagina: Secondary | ICD-10-CM

## 2016-01-12 DIAGNOSIS — N63 Unspecified lump in unspecified breast: Secondary | ICD-10-CM

## 2016-01-12 NOTE — Progress Notes (Signed)
[facility] Visit  Patient name: Terri Reynolds MRN [id number]  Date of birth: 08-06-1985  CC & HPI:  Terri Reynolds is a 30 y.o. African American female presenting today for evaluation of a breast lump and a vaginal lump.  Noticed breast lump a few weeks ago, thinks it has gotten smaller.  Not tender.  Poor historian regarding vaginal issues:  Unsure of if she has had this "lump" before, thinks she has something in the same place before, but not sure when, unsure if this has drained, if it was painful, etc.    Pertinent History Reviewed:  Medical & Surgical Hx:   History reviewed. No pertinent past medical history. Past Surgical History:  Procedure Laterality Date  . LAPAROSCOPIC TUBAL LIGATION Bilateral 09/19/2012   Procedure: LAPAROSCOPIC TUBAL LIGATION;  Surgeon: Lazaro ArmsLuther H Eure, MD;  Location: [facility];  Service: Gynecology;  Laterality: Bilateral;   Family History  Problem Relation Age of Onset  . Diabetes Maternal Grandmother   . Hypertension Brother     Current Outpatient Prescriptions:  .  ibuprofen (ADVIL,MOTRIN) 200 MG tablet, Take 200 mg by mouth every 6 (six) hours as needed., Disp: , Rfl:  Social History: Reviewed -  reports that she has never smoked. She has never used smokeless tobacco.  Review of Systems:   Constitutional: Negative for fever and chills Eyes: Negative for visual disturbances Respiratory: Negative for shortness of breath, dyspnea Cardiovascular: Negative for chest pain or palpitations  Gastrointestinal: Negative for vomiting, diarrhea and constipation; no abdominal pain Genitourinary: Negative for dysuria and urgency, vaginal irritation or itching Musculoskeletal: Negative for back pain, joint pain, myalgias  Neurological: Negative for dizziness and headaches    Objective Findings:    Physical Examination: General appearance - well appearing, and in no distress Mental status - alert, oriented to person, place, and time Chest:  Normal  respiratory effort Breast:  2cmX1cm area of thickened tissue on underside of riight breast:  Feels like its in the skin vs breast tissue Heart - normal rate and regular rhythm Abdomen:  Soft, nontender Pelvic: no ":lump" where pt thought she had felt it (doesn't feel it now) but upon inspection, a thumbsized cyst in introitus.  Not painful, not infected.  This is not what she came for, but would like it removed.  Coexam with Dr. Despina HiddenEure Musculoskeletal:  Normal range of motion without pain Extremities:  No edema    No results found for this or any previous visit (from the past 24 hour(s)).    Assessment & Plan:  A:   Breast nodule  Vagianl cyst P:  Breast US scheduled   Return [facility] for gland excision .  CRESENZO-DISHMAN,Nilo Fallin CNM 8/16/2017no 9:12 AM

## 2016-01-14 ENCOUNTER — Other Ambulatory Visit (HOSPITAL_COMMUNITY): Payer: Self-pay | Admitting: Family Medicine

## 2016-01-14 NOTE — Addendum Note (Signed)
Addended by: Jacklyn ShellRESENZO-DISHMON, Jaleeah Slight on: 01/14/2016 01:54 PM   Modules accepted: Orders

## 2016-01-19 ENCOUNTER — Ambulatory Visit: Payer: PRIVATE HEALTH INSURANCE | Admitting: Obstetrics & Gynecology

## 2016-01-26 ENCOUNTER — Ambulatory Visit (HOSPITAL_COMMUNITY): Payer: PRIVATE HEALTH INSURANCE

## 2016-01-26 ENCOUNTER — Other Ambulatory Visit: Payer: Self-pay | Admitting: Advanced Practice Midwife

## 2016-01-26 ENCOUNTER — Ambulatory Visit (HOSPITAL_COMMUNITY)
Admission: RE | Admit: 2016-01-26 | Discharge: 2016-01-26 | Disposition: A | Discharge status: Home / Self Care | Payer: PRIVATE HEALTH INSURANCE | Source: Ambulatory Visit | Attending: Advanced Practice Midwife | Admitting: Advanced Practice Midwife

## 2016-01-26 DIAGNOSIS — N63 Unspecified lump in unspecified breast: Secondary | ICD-10-CM

## 2016-01-27 ENCOUNTER — Ambulatory Visit: Payer: PRIVATE HEALTH INSURANCE | Admitting: Obstetrics & Gynecology

## 2016-02-03 ENCOUNTER — Other Ambulatory Visit: Payer: Self-pay | Admitting: Advanced Practice Midwife

## 2016-02-03 DIAGNOSIS — N63 Unspecified lump in unspecified breast: Secondary | ICD-10-CM

## 2016-02-03 NOTE — Progress Notes (Unsigned)
Breast US ordered per radiologist request; US order already in EPIC, ordered again (? Duplicate?)

## 2016-02-09 ENCOUNTER — Ambulatory Visit (HOSPITAL_COMMUNITY): Admission: RE | Admit: 2016-02-09 | Payer: PRIVATE HEALTH INSURANCE | Source: Ambulatory Visit

## 2016-04-06 ENCOUNTER — Encounter: Payer: Self-pay | Admitting: Advanced Practice Midwife

## 2016-04-06 ENCOUNTER — Telehealth: Payer: Self-pay | Admitting: Advanced Practice Midwife

## 2016-04-06 ENCOUNTER — Telehealth: Payer: Self-pay | Admitting: *Deleted

## 2016-04-06 NOTE — Telephone Encounter (Signed)
Pt called stating that she was [place] from this morning. Please contact pt

## 2016-04-06 NOTE — Progress Notes (Unsigned)
Letter from [facility] was sent to me because Terri Reynolds did not keep her f/u US in Sept.  I left her a message to call me and/or them to either schedule or cancel

## 2016-04-06 NOTE — Telephone Encounter (Signed)
Called patient to let her know appt was scheduled for Tuesday November 14, check in at 8:45 at [facility]. If this day and time does not work for her to call [phone]. No further questions.

## 2016-04-06 NOTE — Telephone Encounter (Signed)
Returned patients call. Patient stated she got Fran's message but had some questions. [facility].

## 2016-04-12 ENCOUNTER — Ambulatory Visit (HOSPITAL_COMMUNITY)
Admission: RE | Admit: 2016-04-12 | Discharge: 2016-04-12 | Disposition: A | Discharge status: Home / Self Care | Payer: PRIVATE HEALTH INSURANCE | Source: Ambulatory Visit | Attending: Advanced Practice Midwife | Admitting: Advanced Practice Midwife

## 2016-04-12 ENCOUNTER — Other Ambulatory Visit (HOSPITAL_COMMUNITY): Payer: PRIVATE HEALTH INSURANCE

## 2016-04-12 ENCOUNTER — Ambulatory Visit (HOSPITAL_COMMUNITY): Payer: PRIVATE HEALTH INSURANCE

## 2016-04-12 DIAGNOSIS — L723 Sebaceous cyst: Secondary | ICD-10-CM | POA: Insufficient documentation

## 2016-04-12 DIAGNOSIS — N63 Unspecified lump in unspecified breast: Secondary | ICD-10-CM

## 2017-08-30 ENCOUNTER — Ambulatory Visit: Payer: PRIVATE HEALTH INSURANCE | Admitting: Advanced Practice Midwife

## 2017-09-05 ENCOUNTER — Ambulatory Visit (INDEPENDENT_AMBULATORY_CARE_PROVIDER_SITE_OTHER): Payer: PRIVATE HEALTH INSURANCE | Admitting: Advanced Practice Midwife

## 2017-09-05 ENCOUNTER — Encounter: Payer: Self-pay | Admitting: Advanced Practice Midwife

## 2017-09-05 VITALS — BP 124/80 | HR 89 | Ht 65.0 in | Wt 287.0 lb

## 2017-09-05 DIAGNOSIS — N898 Other specified noninflammatory disorders of vagina: Secondary | ICD-10-CM

## 2017-09-05 MED ORDER — NYSTATIN-TRIAMCINOLONE 100000-0.1 UNIT/GM-% EX OINT: 1.0000 "application " | TOPICAL_OINTMENT | Freq: Two times a day (BID) | CUTANEOUS | 0 refills | Status: DC

## 2017-09-05 NOTE — Progress Notes (Signed)
[facility] Visit  Patient name: Terri Reynolds MRN [id number]  Date of birth: [birth year]  CC & HPI:  Terri Reynolds is a 32 y.o. African American female presenting today for groin irritation for 2-4 weeks, not really sure.  Had been using deodorant for quite a while in groin.  In the past 2-4 weeks, has noticed an irritation in the groin area, occ itch.  Has not tried any otc remedies.   Pertinent History Reviewed:  Medical & Surgical Hx:   Past Medical History:  Diagnosis Date  . Hypertension    Past Surgical History:  Procedure Laterality Date  . LAPAROSCOPIC TUBAL LIGATION Bilateral 09/19/2012   Procedure: LAPAROSCOPIC TUBAL LIGATION;  Surgeon: Lazaro ArmsLuther H Eure, MD;  Location: [facility];  Service: Gynecology;  Laterality: Bilateral;   Family History  Problem Relation Age of Onset  . Diabetes Maternal Grandmother   . Hypertension Brother     Current Outpatient Medications:  .  ibuprofen (ADVIL,MOTRIN) 200 MG tablet, Take 200 mg by mouth every 6 (six) hours as needed., Disp: , Rfl:  .  metoprolol tartrate (LOPRESSOR) 25 MG tablet, Take 25 mg by mouth daily., Disp: , Rfl: 0 .  nystatin-triamcinolone ointment (MYCOLOG), Apply 1 application topically 2 (two) times daily., Disp: 30 g, Rfl: 0 Social History: Reviewed -  reports that she has never smoked. She has never used smokeless tobacco.  Review of Systems:   Constitutional: Negative for fever and chills Eyes: Negative for visual disturbances Respiratory: Negative for shortness of breath, dyspnea Cardiovascular: Negative for chest pain or palpitations  Gastrointestinal: Negative for vomiting, diarrhea and constipation; no abdominal pain Genitourinary: Negative for dysuria and urgency, vaginal irritation or itching Musculoskeletal: Negative for back pain, joint pain, myalgias  Neurological: Negative for dizziness and headaches    Objective Findings:    Physical Examination: General appearance - well appearing, and  in no distress Mental status - alert, oriented to person, place, and time Chest:  Normal respiratory effort Heart - normal rate and regular rhythm Abdomen:  Soft, nontender Pelvic: Discolored area in groin, but not classically yeast.  Also has 3 small excoriated areas that COULD be c/w healing HSV.  Just really impossible to tell at this point.  Culture taken.  Musculoskeletal:  Normal range of motion without pain Extremities:  No edema    No results found for this or any previous visit (from the past 24 hour(s)).    Assessment & Plan:  A:   HSV vs yeast vs irritation from deodorant. P:  Nystatin cream BID for 1 week'  No more deodorant  If sx recur, come back asap for a culture (unless today's culture is +, then we have the answer).    No follow-ups on file.  Jacklyn ShellFrances Cresenzo-Dishmon CNM 09/05/2017 12:41 PM

## 2017-09-08 LAB — HERPES SIMPLEX VIRUS CULTURE

## 2018-07-07 IMAGING — US US BREAST*R* LIMITED INC AXILLA
1 series · 12 of 12 positions shown · non-contrast
Comparison: None.

CLINICAL DATA: 30-year-old female with a palpable abnormality in
the lower inner right breast. The patient states this was previously
much more sore in slightly larger, however feel as if this is
slightly less lower [REDACTED] have decreased in size.

EXAM:
2D DIGITAL DIAGNOSTIC BILATERAL MAMMOGRAM WITH CAD AND ADJUNCT TOMO
RIGHT BREAST ULTRASOUND

[Series 1: us breast*right* limited inc axilla · 0.06mm/px · 12 of 12 slices shown]
[im 1/12]
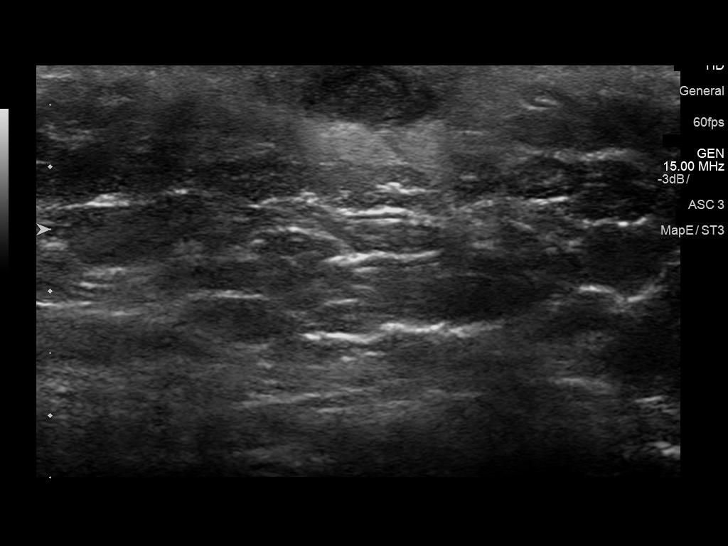
[im 2/12]
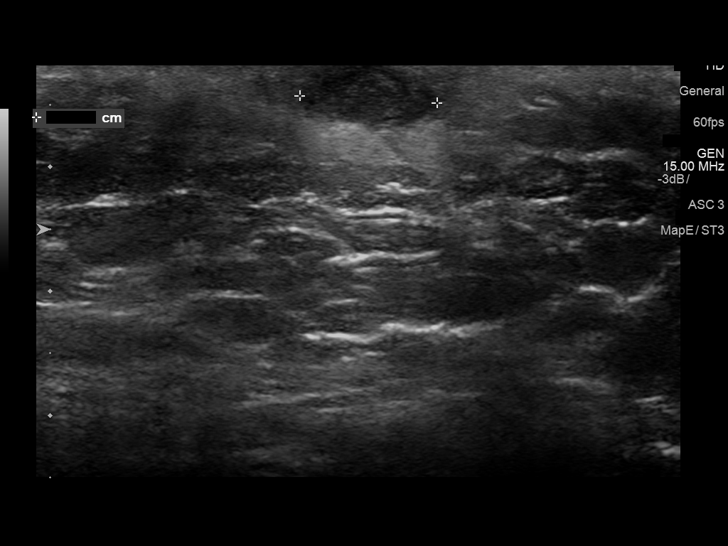
[im 3/12]
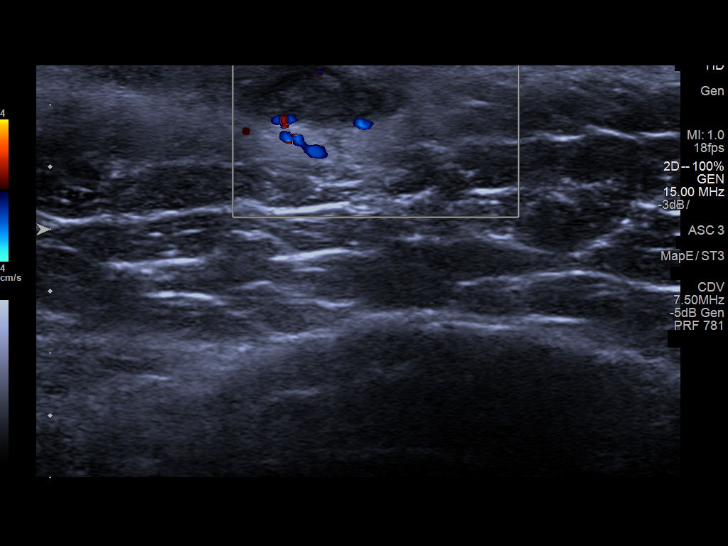
[im 4/12]
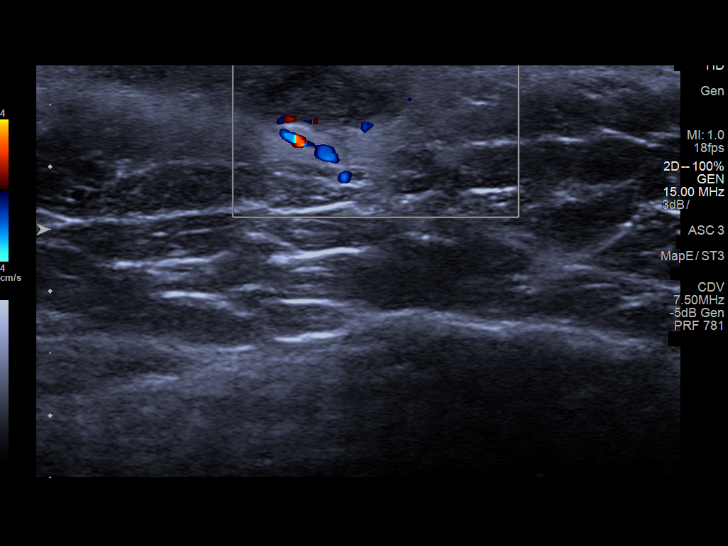
[im 5/12]
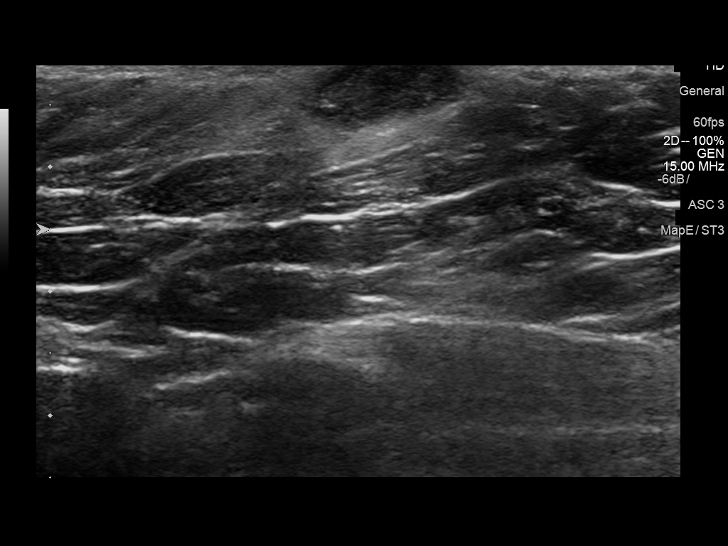
[im 6/12]
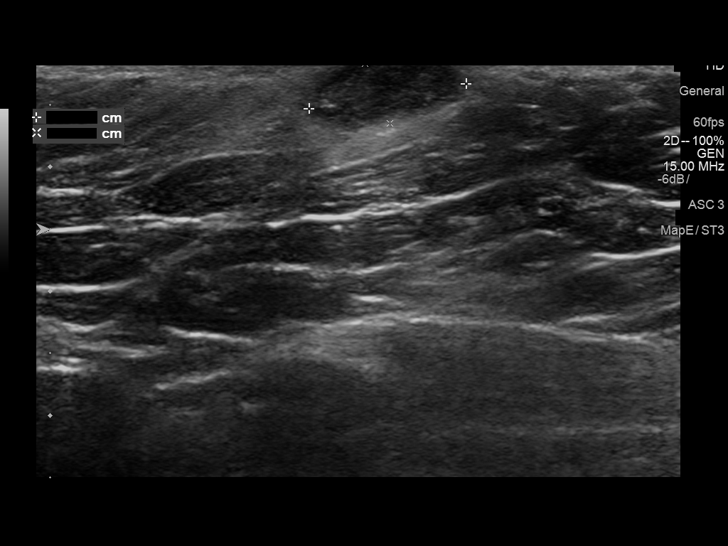
[im 7/12]
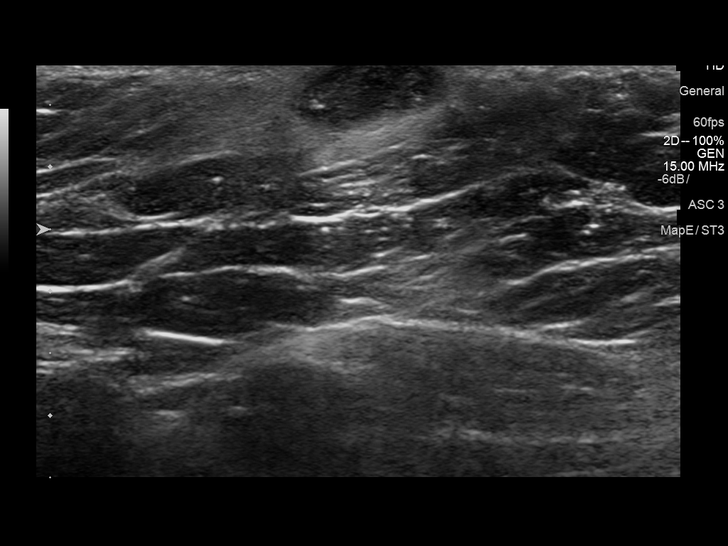
[im 8/12]
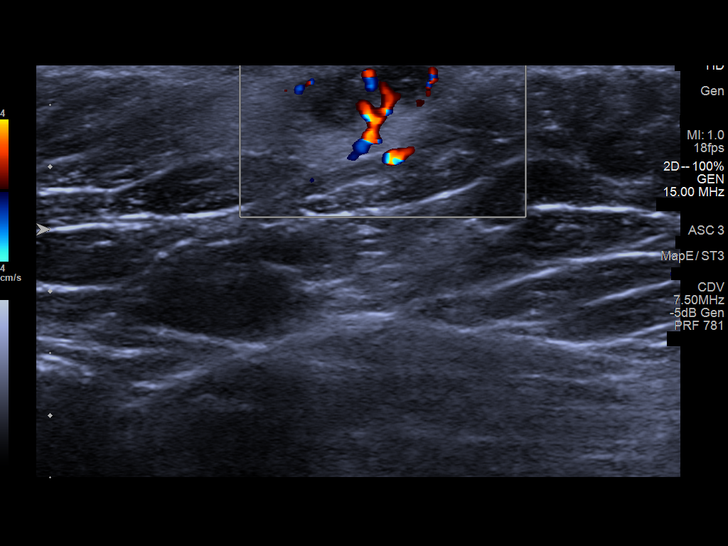
[im 9/12]
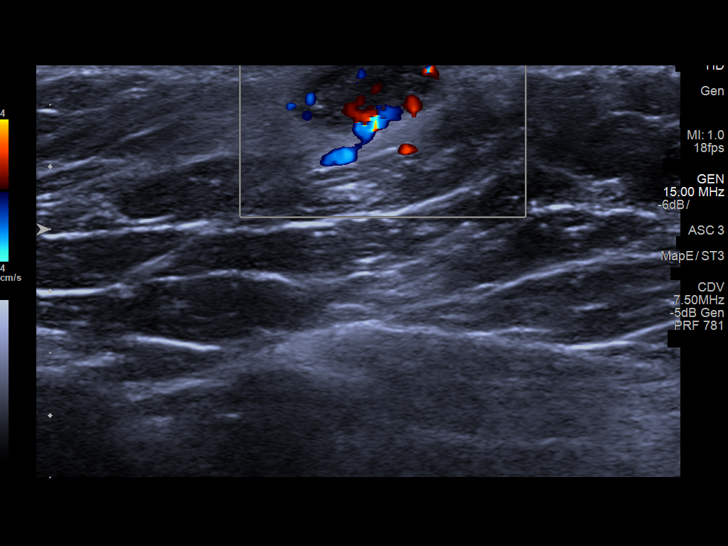
[im 10/12]
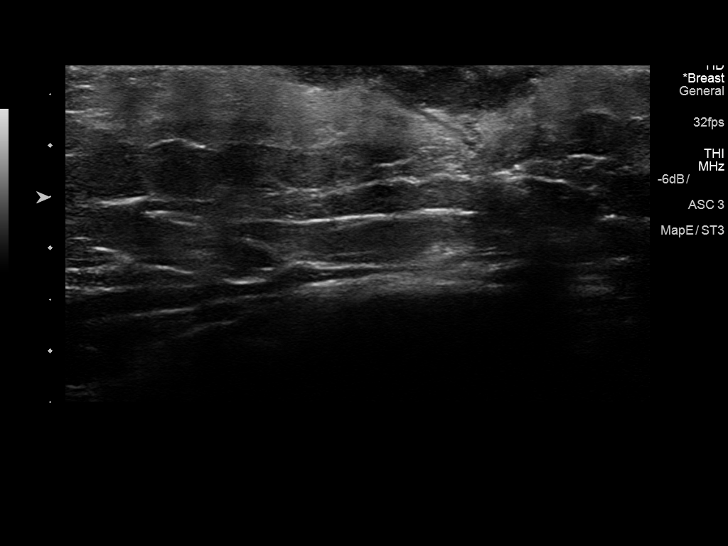
[im 11/12]
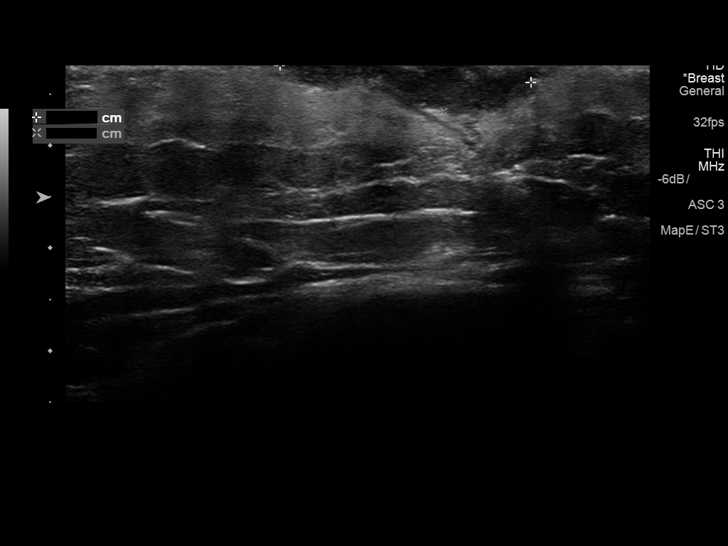
[im 12/12]
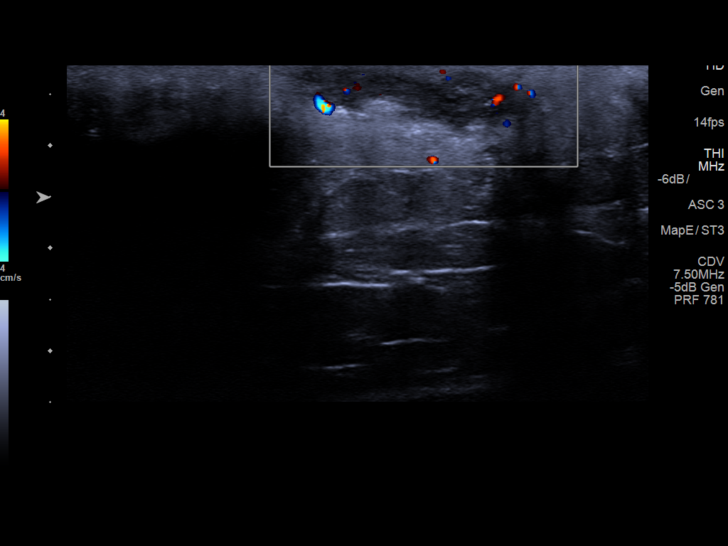

[12 of 12 positions shown; findings below may reference images not displayed]

ACR Breast Density Category b: There are scattered areas of
fibroglandular density.
FINDINGS: No suspicious masses or calcifications are seen in the left breast.
Spot compression CC tomograms were performed over the palpable area
of concern in the far lower inner right breast demonstrating a vague
oval asymmetry/possible mass which may involve skin, best seen on
the full CC tomograms.

Mammographic images were processed with CAD.

Physical examination of the lower inner right breast reveals a firm
area of thickening involving skin.

Targeted ultrasound of the right breast was performed demonstrating
an irregular mass/possible collection at [DATE] 13 cm from nipple
measuring 2.5 x 0.5 x 1.1 cm. This demonstrates increased
vascularity. This is may represent an inflamed/infected sebaceous
cyst or small abscess.
IMPRESSION: Probably benign right breast mass, possibly representing an
infected/inflamed sebaceous cyst or small abscess.

RECOMMENDATION:
The patient will be prescribed a 10 day course of doxycycline and
has been instructed to followup in 2 weeks for a repeat right breast
ultrasound to assess for improvement/resolution of the right breast
mass.

I have discussed the findings and recommendations with the patient.
Results were also provided in writing at the conclusion of the
visit. If applicable, a reminder letter will be sent to the patient
regarding the next appointment.

BI-RADS CATEGORY  3: Probably benign.

## 2018-11-01 DIAGNOSIS — S43492A Other sprain of left shoulder joint, initial encounter: Secondary | ICD-10-CM | POA: Diagnosis not present

## 2018-11-01 DIAGNOSIS — Z6841 Body Mass Index (BMI) 40.0 and over, adult: Secondary | ICD-10-CM | POA: Diagnosis not present

## 2018-12-20 DIAGNOSIS — Z6841 Body Mass Index (BMI) 40.0 and over, adult: Secondary | ICD-10-CM | POA: Diagnosis not present

## 2018-12-20 DIAGNOSIS — L278 Dermatitis due to other substances taken internally: Secondary | ICD-10-CM | POA: Diagnosis not present

## 2019-05-27 ENCOUNTER — Other Ambulatory Visit: Payer: Self-pay

## 2019-05-27 ENCOUNTER — Ambulatory Visit: Payer: PRIVATE HEALTH INSURANCE | Attending: Internal Medicine

## 2019-05-27 DIAGNOSIS — Z20822 Contact with and (suspected) exposure to covid-19: Secondary | ICD-10-CM

## 2019-05-29 LAB — NOVEL CORONAVIRUS, NAA: SARS-CoV-2, NAA: NOT DETECTED

## 2019-07-22 DIAGNOSIS — Z6841 Body Mass Index (BMI) 40.0 and over, adult: Secondary | ICD-10-CM | POA: Diagnosis not present

## 2019-07-22 DIAGNOSIS — Z0000 Encounter for general adult medical examination without abnormal findings: Secondary | ICD-10-CM | POA: Diagnosis not present

## 2021-06-02 ENCOUNTER — Other Ambulatory Visit: Payer: PRIVATE HEALTH INSURANCE | Admitting: Advanced Practice Midwife

## 2021-06-04 ENCOUNTER — Other Ambulatory Visit: Payer: PRIVATE HEALTH INSURANCE | Admitting: Adult Health

## 2021-07-08 ENCOUNTER — Other Ambulatory Visit: Payer: PRIVATE HEALTH INSURANCE | Admitting: Advanced Practice Midwife

## 2021-07-22 ENCOUNTER — Other Ambulatory Visit: Payer: PRIVATE HEALTH INSURANCE | Admitting: Advanced Practice Midwife

## 2021-08-26 ENCOUNTER — Other Ambulatory Visit: Payer: PRIVATE HEALTH INSURANCE | Admitting: Adult Health

## 2021-09-16 ENCOUNTER — Encounter: Payer: Self-pay | Admitting: Advanced Practice Midwife

## 2021-09-16 ENCOUNTER — Ambulatory Visit (INDEPENDENT_AMBULATORY_CARE_PROVIDER_SITE_OTHER): Payer: BC Managed Care – PPO | Admitting: Advanced Practice Midwife

## 2021-09-16 ENCOUNTER — Other Ambulatory Visit (HOSPITAL_COMMUNITY)
Admission: RE | Admit: 2021-09-16 | Discharge: 2021-09-16 | Disposition: A | Discharge status: Home / Self Care | Payer: BC Managed Care – PPO | Source: Ambulatory Visit | Attending: Advanced Practice Midwife | Admitting: Advanced Practice Midwife

## 2021-09-16 VITALS — BP 126/84 | HR 66 | Ht 65.0 in | Wt 299.0 lb

## 2021-09-16 DIAGNOSIS — Z13220 Encounter for screening for lipoid disorders: Secondary | ICD-10-CM | POA: Diagnosis not present

## 2021-09-16 DIAGNOSIS — Z131 Encounter for screening for diabetes mellitus: Secondary | ICD-10-CM | POA: Diagnosis not present

## 2021-09-16 DIAGNOSIS — Z01419 Encounter for gynecological examination (general) (routine) without abnormal findings: Secondary | ICD-10-CM | POA: Diagnosis present

## 2021-09-16 DIAGNOSIS — Z1321 Encounter for screening for nutritional disorder: Secondary | ICD-10-CM | POA: Diagnosis not present

## 2021-09-16 NOTE — Progress Notes (Signed)
Terri Reynolds ?36 y.o. ? ?Vitals:  ? 09/16/21 0855  ?BP: 126/84  ?Pulse: 66  ? ?  ?Filed Weights  ? 09/16/21 0855  ?Weight: 299 lb (135.6 kg)  ? ? ?Past Medical History: ?Past Medical History:  ?Diagnosis Date  ? Hypertension   ? ? ?Past Surgical History: ?Past Surgical History:  ?Procedure Laterality Date  ? LAPAROSCOPIC TUBAL LIGATION Bilateral 09/19/2012  ? Procedure: LAPAROSCOPIC TUBAL LIGATION;  Surgeon: Lazaro Arms, MD;  Location: [facility];  Service: Gynecology;  Laterality: Bilateral;  ? ? ?Family History: ?Family History  ?Problem Relation Age of Onset  ? Diabetes Maternal Grandmother   ? Hypertension Brother   ? ? ?Social History: ?Social History  ? ?Tobacco Use  ? Smoking status: Never  ? Smokeless tobacco: Never  ?Vaping Use  ? Vaping Use: Never used  ?Substance Use Topics  ? Alcohol use: Yes  ?  Alcohol/week: 0.0 standard drinks  ?  Comment: occasional drink once a month  ? Drug use: No  ? ? ?Allergies: No Known Allergies ? ? ?No current outpatient medications on file. ? ?History of Present Illness: Here for pap and physical. Last pap normal in 2016.  Has BTL, periods are fine.  Declines STI testing.   ? ? ?Review of Systems  ? ?Patient denies any headaches, blurred vision, shortness of breath, chest pain, abdominal pain, problems with bowel movements, urination, or intercourse. ? ? ?Physical Exam: ?General:  Well developed, well nourished, no acute distress ?Skin:  Warm and dry ?Neck:  Midline trachea, normal thyroid ?Lungs; Clear to auscultation bilaterally ?Breast:  No dominant palpable mass, retraction, or nipple discharge ?Cardiovascular: Regular rate and rhythm ?Abdomen:  Soft, non tender, no hepatosplenomegaly ?Pelvic:  External genitalia is normal in appearance.  The vagina is normal in appearance.  The cervix is bulbous.  Uterus is felt to be normal size, shape, and contour.  No adnexal masses or tenderness noted. Exam limited by habitus ?Extremities:  No swelling or varicosities noted ?Psych:   No mood changes.   ? ? ?Impression: Normal GYN exam ? ? ? ? ?Plan: If pap normal, repeat in 3-5 years ?Orders Placed This Encounter  ?Procedures  ? CBC  ? Comprehensive metabolic panel  ?  Order Specific Question:   Has the patient fasted?  ?  Answer:   Yes  ? Hemoglobin A1c  ? VITAMIN D 25 Hydroxy (Vit-D Deficiency, Fractures)  ? Lipid panel  ?  Order Specific Question:   Has the patient fasted?  ?  Answer:   Yes  ? ? ? ? ? ?  ? ? ? ?

## 2021-09-16 NOTE — Patient Instructions (Signed)
[place] [facility]:       [facility] [phone]                 [facility] [phone] (usually not accepting new patients unless you have family there already, you are always welcome to call and ask)      [facility] [phone]   [facility]:  [phone] [facility]:  [phone]     [place] [facility]:  [facility]: [phone] [facility]: [phone]  [place] [facility]:  [facility]: [phone]  [facility]: [phone]  [place] [facility]: [facility]: [phone]   

## 2021-09-17 ENCOUNTER — Other Ambulatory Visit: Payer: Self-pay | Admitting: Advanced Practice Midwife

## 2021-09-17 LAB — COMPREHENSIVE METABOLIC PANEL
ALT: 13 IU/L (ref 0–32)
AST: 16 IU/L (ref 0–40)
Albumin/Globulin Ratio: 1.4 (ref 1.2–2.2)
Albumin: 4 g/dL (ref 3.8–4.8)
Alkaline Phosphatase: 74 IU/L (ref 44–121)
BUN/Creatinine Ratio: 5 — ABNORMAL LOW (ref 9–23)
BUN: 4 mg/dL — ABNORMAL LOW (ref 6–20)
Bilirubin Total: 0.7 mg/dL (ref 0.0–1.2)
CO2: 22 mmol/L (ref 20–29)
Calcium: 9 mg/dL (ref 8.7–10.2)
Chloride: 104 mmol/L (ref 96–106)
Creatinine, Ser: 0.85 mg/dL (ref 0.57–1.00)
Globulin, Total: 2.9 g/dL (ref 1.5–4.5)
Glucose: 82 mg/dL (ref 70–99)
Potassium: 4.2 mmol/L (ref 3.5–5.2)
Sodium: 137 mmol/L (ref 134–144)
Total Protein: 6.9 g/dL (ref 6.0–8.5)
eGFR: 91 mL/min/{1.73_m2} (ref >59)

## 2021-09-17 LAB — CBC
Hematocrit: 35.8 % (ref 34.0–46.6)
Hemoglobin: 11.2 g/dL (ref 11.1–15.9)
MCH: 25.5 pg — ABNORMAL LOW (ref 26.6–33.0)
MCHC: 31.3 g/dL — ABNORMAL LOW (ref 31.5–35.7)
MCV: 82 fL (ref 79–97)
Platelets: 341 10*3/uL (ref 150–450)
RBC: 4.39 x10E6/uL (ref 3.77–5.28)
RDW: 14.2 % (ref 11.7–15.4)
WBC: 9 10*3/uL (ref 3.4–10.8)

## 2021-09-17 LAB — LIPID PANEL
Chol/HDL Ratio: 3.3 ratio (ref 0.0–4.4)
Cholesterol, Total: 163 mg/dL (ref 100–199)
HDL: 49 mg/dL (ref >39)
LDL Chol Calc (NIH): 97 mg/dL (ref 0–99)
Triglycerides: 94 mg/dL (ref 0–149)
VLDL Cholesterol Cal: 17 mg/dL (ref 5–40)

## 2021-09-17 LAB — CYTOLOGY - PAP
Adequacy: ABSENT
Comment: NEGATIVE
Diagnosis: NEGATIVE
High risk HPV: NEGATIVE

## 2021-09-17 LAB — VITAMIN D 25 HYDROXY (VIT D DEFICIENCY, FRACTURES): Vit D, 25-Hydroxy: 13.5 ng/mL — ABNORMAL LOW (ref 30.0–100.0)

## 2021-09-17 LAB — HEMOGLOBIN A1C
Est. average glucose Bld gHb Est-mCnc: 94 mg/dL
Hgb A1c MFr Bld: 4.9 % (ref 4.8–5.6)

## 2021-09-17 MED ORDER — VITAMIN D (ERGOCALCIFEROL) 1.25 MG (50000 UNIT) PO CAPS: 50000.0000 [IU] | ORAL_CAPSULE | ORAL | 0 refills | Status: DC

## 2021-09-17 MED ORDER — VITAMIN D3 50 MCG (2000 UT) PO CAPS: 2000.0000 [IU] | ORAL_CAPSULE | Freq: Every day | ORAL | 4 refills | Status: DC

## 2021-09-22 ENCOUNTER — Encounter: Payer: Self-pay | Admitting: Advanced Practice Midwife

## 2022-05-09 ENCOUNTER — Ambulatory Visit: Payer: BC Managed Care – PPO | Admitting: Family Medicine

## 2022-05-09 VITALS — BP 132/82 | Ht 66.0 in | Wt 301.4 lb

## 2022-05-09 DIAGNOSIS — E559 Vitamin D deficiency, unspecified: Secondary | ICD-10-CM

## 2022-05-09 DIAGNOSIS — Z6841 Body Mass Index (BMI) 40.0 and over, adult: Secondary | ICD-10-CM | POA: Diagnosis not present

## 2022-05-09 DIAGNOSIS — R0982 Postnasal drip: Secondary | ICD-10-CM | POA: Diagnosis not present

## 2022-05-09 MED ORDER — FLUTICASONE PROPIONATE 50 MCG/ACT NA SUSP: 2.0000 | Freq: Every day | NASAL | 6 refills | Status: DC

## 2022-05-09 MED ORDER — VITAMIN D (ERGOCALCIFEROL) 1.25 MG (50000 UNIT) PO CAPS: 50000.0000 [IU] | ORAL_CAPSULE | ORAL | 0 refills | Status: DC

## 2022-05-09 NOTE — Progress Notes (Signed)
Subjective:  Patient ID: Terri Reynolds, female    DOB: 01-06-1986  Age: 36 y.o. MRN: [id number]  CC: Chief Complaint  Patient presents with   Establish Care    No problems or concerns    HPI:  36 year old female with morbid obesity presents to establish care.  Patient is concerned about her weight.  No significant interventions at this time.  Will discuss treatments for obesity today.  Patient had a wellness visit earlier this year with OB/GYN.  Patient's labs were reviewed today.  Labs notable for low vitamin D of 13.5.  She was started on treatment but never got the prescription filled.  Additionally, patient reports ongoing issues with mucus in her throat.  She is describing postnasal drip.  She states that this occurs a few times a year.  Patient has no other complaints or concerns at this time.   Social Hx   Social History   Socioeconomic History   Marital status: Single    Spouse name: Not on file   Number of children: Not on file   Years of education: Not on file   Highest education level: Not on file  Occupational History   Not on file  Tobacco Use   Smoking status: Never   Smokeless tobacco: Never  Vaping Use   Vaping Use: Never used  Substance and Sexual Activity   Alcohol use: Yes    Alcohol/week: 0.0 standard drinks of alcohol    Comment: occasional drink once a month   Drug use: No   Sexual activity: Yes    Partners: Male    Birth control/protection: Surgical  Other Topics Concern   Not on file  Social History Narrative   Not on file   Social Determinants of Health   Financial Resource Strain: Low Risk  (09/16/2021)   Overall Financial Resource Strain (CARDIA)    Difficulty of Paying Living Expenses: Not very hard  Food Insecurity: No Food Insecurity (09/16/2021)   Hunger Vital Sign    Worried About Running Out of Food in the Last Year: Never true    Ran Out of Food in the Last Year: Never true  Transportation Needs: No Transportation Needs  (09/16/2021)   PRAPARE - Administrator, Civil Service (Medical): No    Lack of Transportation (Non-Medical): No  Physical Activity: Insufficiently Active (09/16/2021)   Exercise Vital Sign    Days of Exercise per Week: 1 day    Minutes of Exercise per Session: 30 min  Stress: No Stress Concern Present (09/16/2021)   [employer] of Occupational Health - Occupational Stress Questionnaire    Feeling of Stress : Not at all  Social Connections: Unknown (09/16/2021)   Social Connection and Isolation Panel [NHANES]    Frequency of Communication with Friends and Family: Three times a week    Frequency of Social Gatherings with Friends and Family: Once a week    Attends Religious Services: Patient refused    Database administrator or Organizations: Yes    Attends Engineer, structural: More than 4 times per year    Marital Status: Married    Review of Systems Per HPI  Objective:  BP 132/82   Ht 5\' 6"  (1.676 m)   Wt (!) 301 lb 6.4 oz (136.7 kg)   BMI 48.65 kg/m      05/09/2022    9:50 AM 09/16/2021    8:55 AM 09/05/2017   12:14 PM  BP/Weight  Systolic BP 132  126 124  Diastolic BP 82 84 80  Wt. (Lbs) 301.4 299 287  BMI 48.65 kg/m2 49.76 kg/m2 47.76 kg/m2    Physical Exam Vitals and nursing note reviewed.  Constitutional:      General: She is not in acute distress.    Appearance: Normal appearance. She is obese.  HENT:     Head: Normocephalic and atraumatic.     Mouth/Throat:     Pharynx: Oropharynx is clear.  Eyes:     General:        Right eye: No discharge.        Left eye: No discharge.     Conjunctiva/sclera: Conjunctivae normal.  Cardiovascular:     Rate and Rhythm: Normal rate and regular rhythm.  Pulmonary:     Effort: Pulmonary effort is normal.     Breath sounds: Normal breath sounds. No wheezing, rhonchi or rales.  Neurological:     Mental Status: She is alert.  Psychiatric:        Mood and Affect: Mood normal.        Behavior:  Behavior normal.     Lab Results  Component Value Date   WBC 9.0 09/16/2021   HGB 11.2 09/16/2021   HCT 35.8 09/16/2021   PLT 341 09/16/2021   GLUCOSE 82 09/16/2021   CHOL 163 09/16/2021   TRIG 94 09/16/2021   HDL 49 09/16/2021   LDLCALC 97 09/16/2021   ALT 13 09/16/2021   AST 16 09/16/2021   NA 137 09/16/2021   K 4.2 09/16/2021   CL 104 09/16/2021   CREATININE 0.85 09/16/2021   BUN 4 (L) 09/16/2021   CO2 22 09/16/2021   TSH 1.241 06/19/2014   HGBA1C 4.9 09/16/2021     Assessment & Plan:   Problem List Items Addressed This Visit       Other   Morbid obesity with BMI of 40.0-44.9, adult (HCC)    Discussed weight loss treatments particularly Wegovy. Discussed dietary recommendations. Patient would like to work on this herself without the use of medication.      Postnasal drip    Treating with Flonase.      Vitamin D deficiency - Primary    Rx sent for prescription strength vitamin D.       Meds ordered this encounter  Medications   Vitamin D, Ergocalciferol, (DRISDOL) 1.25 MG (50000 UNIT) CAPS capsule    Sig: Take 1 capsule (50,000 Units total) by mouth every 7 (seven) days.    Dispense:  12 capsule    Refill:  0   fluticasone (FLONASE) 50 MCG/ACT nasal spray    Sig: Place 2 sprays into both nostrils daily.    Dispense:  16 g    Refill:  6    Follow-up: Annually  Everlene Other DO [facility]

## 2022-05-09 NOTE — Assessment & Plan Note (Signed)
Rx sent for prescription strength vitamin D.

## 2022-05-09 NOTE — Assessment & Plan Note (Signed)
Treating with Flonase. 

## 2022-05-09 NOTE — Assessment & Plan Note (Signed)
Discussed weight loss treatments particularly Wegovy. Discussed dietary recommendations. Patient would like to work on this herself without the use of medication.

## 2022-05-09 NOTE — Patient Instructions (Signed)
Medication as prescribed.  Follow up annually.  Take care  Dr. Arial Galligan  

## 2022-07-31 ENCOUNTER — Other Ambulatory Visit: Payer: Self-pay | Admitting: Family Medicine

## 2022-12-26 ENCOUNTER — Ambulatory Visit: Payer: 59 | Admitting: Family Medicine

## 2022-12-26 VITALS — BP 126/87 | HR 76 | Temp 98.2°F | Ht 66.0 in | Wt 298.8 lb

## 2022-12-26 DIAGNOSIS — M722 Plantar fascial fibromatosis: Secondary | ICD-10-CM | POA: Insufficient documentation

## 2022-12-26 DIAGNOSIS — Z809 Family history of malignant neoplasm, unspecified: Secondary | ICD-10-CM | POA: Diagnosis not present

## 2022-12-26 MED ORDER — MELOXICAM 15 MG PO TABS: 15.0000 mg | ORAL_TABLET | Freq: Every day | ORAL | 0 refills | Status: DC | PRN

## 2022-12-26 NOTE — Assessment & Plan Note (Signed)
Referring for genetic counseling.

## 2022-12-26 NOTE — Progress Notes (Signed)
Subjective:  Patient ID: Terri Reynolds, female    DOB: 03/27/1986  Age: 37 y.o. MRN: [id number]  CC: Chief Complaint  Patient presents with   feet pain    Pt states Terri Reynolds is having pain in both of her feet mainly in her heels and sometimes travels up her ankles. This is mostly a problem in the morning when Terri Reynolds first gets up and dulls down throughout the day but still has pain.    HPI:  37 year old female presents for evaluation of the above.  Patient reports that over the past month Terri Reynolds has had pain in the heels bilaterally.  Worse in the morning.  He states that it seems to travel up at times.  Achy.  No relieving factors.  Additionally, patient concerned given family history of cancer.  Terri Reynolds states that her sister has had some sort of gastrointestinal cancer, mother has had lung cancer, paternal grandmother had breast cancer, aunt had cancer as well.  Terri Reynolds is very concerned about this.  Will discuss genetic screening.   Patient Active Problem List   Diagnosis Date Noted   Family history of cancer 12/26/2022   Plantar fasciitis 12/26/2022   Postnasal drip 05/09/2022   Vitamin D deficiency 05/09/2022   Morbid obesity with BMI of 40.0-44.9, adult (HCC) 06/11/2014    Social Hx   Social History   Socioeconomic History   Marital status: Single    Spouse name: Not on file   Number of children: Not on file   Years of education: Not on file   Highest education level: Not on file  Occupational History   Not on file  Tobacco Use   Smoking status: Never   Smokeless tobacco: Never  Vaping Use   Vaping status: Never Used  Substance and Sexual Activity   Alcohol use: Yes    Alcohol/week: 0.0 standard drinks of alcohol    Comment: occasional drink once a month   Drug use: No   Sexual activity: Yes    Partners: Male    Birth control/protection: Surgical  Other Topics Concern   Not on file  Social History Narrative   Not on file   Social Determinants of Health   Financial  Resource Strain: Low Risk  (09/16/2021)   Overall Financial Resource Strain (CARDIA)    Difficulty of Paying Living Expenses: Not very hard  Food Insecurity: No Food Insecurity (09/16/2021)   Hunger Vital Sign    Worried About Running Out of Food in the Last Year: Never true    Ran Out of Food in the Last Year: Never true  Transportation Needs: No Transportation Needs (09/16/2021)   PRAPARE - Administrator, Civil Service (Medical): No    Lack of Transportation (Non-Medical): No  Physical Activity: Insufficiently Active (09/16/2021)   Exercise Vital Sign    Days of Exercise per Week: 1 day    Minutes of Exercise per Session: 30 min  Stress: No Stress Concern Present (09/16/2021)   [employer] of Occupational Health - Occupational Stress Questionnaire    Feeling of Stress : Not at all  Social Connections: Unknown (09/16/2021)   Social Connection and Isolation Panel [NHANES]    Frequency of Communication with Friends and Family: Three times a week    Frequency of Social Gatherings with Friends and Family: Once a week    Attends Religious Services: Patient declined    Active Member of Clubs or Organizations: Yes    Attends Banker Meetings: More  than 4 times per year    Marital Status: Married    Review of Systems Per HPI  Objective:  BP 126/87   Pulse 76   Temp 98.2 F (36.8 C)   Ht 5\' 6"  (1.676 m)   Wt 298 lb 12.8 oz (135.5 kg)   SpO2 99%   BMI 48.23 kg/m      12/26/2022    2:05 PM 05/09/2022    9:50 AM 09/16/2021    8:55 AM  BP/Weight  Systolic BP 126 132 126  Diastolic BP 87 82 84  Wt. (Lbs) 298.8 301.4 299  BMI 48.23 kg/m2 48.65 kg/m2 49.76 kg/m2    Physical Exam Vitals and nursing note reviewed.  Constitutional:      Appearance: Normal appearance. Terri Reynolds is obese.  Cardiovascular:     Rate and Rhythm: Normal rate and regular rhythm.  Pulmonary:     Effort: Pulmonary effort is normal.     Breath sounds: Normal breath sounds.   Musculoskeletal:     Comments: Plantar aspect of the heel with mild tenderness to palpation.  Neurological:     Mental Status: Terri Reynolds is alert.  Psychiatric:        Mood and Affect: Mood normal.        Behavior: Behavior normal.     Lab Results  Component Value Date   WBC 9.0 09/16/2021   HGB 11.2 09/16/2021   HCT 35.8 09/16/2021   PLT 341 09/16/2021   GLUCOSE 82 09/16/2021   CHOL 163 09/16/2021   TRIG 94 09/16/2021   HDL 49 09/16/2021   LDLCALC 97 09/16/2021   ALT 13 09/16/2021   AST 16 09/16/2021   NA 137 09/16/2021   K 4.2 09/16/2021   CL 104 09/16/2021   CREATININE 0.85 09/16/2021   BUN 4 (L) 09/16/2021   CO2 22 09/16/2021   TSH 1.241 06/19/2014   HGBA1C 4.9 09/16/2021     Assessment & Plan:   Problem List Items Addressed This Visit       Musculoskeletal and Integument   Plantar fasciitis - Primary    Advise rest, ice, stretches, heel cups.  Meloxicam as directed.        Other   Family history of cancer    Referring for genetic counseling.      Relevant Orders   Ambulatory referral to Genetics    Meds ordered this encounter  Medications   meloxicam (MOBIC) 15 MG tablet    Sig: Take 1 tablet (15 mg total) by mouth daily as needed for pain.    Dispense:  30 tablet    Refill:  0    Divonte Senger DO [facility]

## 2022-12-26 NOTE — Patient Instructions (Signed)
Ice, heel cups, stretches.  Medication as directed.   Referral placed.

## 2022-12-26 NOTE — Assessment & Plan Note (Signed)
Advise rest, ice, stretches, heel cups.  Meloxicam as directed.

## 2022-12-27 ENCOUNTER — Telehealth: Payer: Self-pay | Admitting: Genetic Counselor

## 2022-12-27 NOTE — Telephone Encounter (Signed)
Contacted pt to schedule an appt. Unable to reach via phone, voicemail was left.   Scheduling Message Entered by Lynda Rainwater on 12/26/2022 at  5:10 PM Priority: Routine GEN COUNSEL 60  Department: [facility]  Provider:  Appointment Notes:  family history of cancer  Scheduling Notes:  Genetics

## 2023-01-26 ENCOUNTER — Ambulatory Visit: Payer: 59 | Admitting: Women's Health

## 2023-02-02 ENCOUNTER — Telehealth: Payer: Self-pay | Admitting: Genetic Counselor

## 2023-02-02 NOTE — Telephone Encounter (Signed)
Answered questions about genetics appt/genetic testing.  Patient said she is unsure whether she wants to learn of a genetic predisposition.  Discussed that we will review pros/cons of genetic testing during appt to help her make decisions.  Discussed that we can make recommendations for screening based on family history alone at time of appt.

## 2023-02-03 ENCOUNTER — Inpatient Hospital Stay: Payer: 59 | Admitting: Genetic Counselor

## 2023-02-23 ENCOUNTER — Ambulatory Visit: Payer: 59 | Admitting: Advanced Practice Midwife

## 2023-02-23 ENCOUNTER — Encounter: Payer: Self-pay | Admitting: Advanced Practice Midwife

## 2023-02-23 VITALS — BP 128/86 | HR 65 | Ht 66.0 in | Wt 300.0 lb

## 2023-02-23 DIAGNOSIS — Z01419 Encounter for gynecological examination (general) (routine) without abnormal findings: Secondary | ICD-10-CM

## 2023-02-23 NOTE — Progress Notes (Signed)
Terri Reynolds 37 y.o.  Vitals:   02/23/23 1349  BP: 128/86  Pulse: 65     Filed Weights   02/23/23 1349  Weight: 300 lb (136.1 kg)    Past Medical History: Past Medical History:  Diagnosis Date   Hypertension     Past Surgical History: Past Surgical History:  Procedure Laterality Date   LAPAROSCOPIC TUBAL LIGATION Bilateral 09/19/2012   Procedure: LAPAROSCOPIC TUBAL LIGATION;  Surgeon: Lazaro Arms, MD;  Location: [facility];  Service: Gynecology;  Laterality: Bilateral;    Family History: Family History  Problem Relation Age of Onset   Cancer Maternal Grandmother    Diabetes Maternal Grandmother    Cancer Mother    Hypertension Brother    Cancer Sister     Social History: Social History   Tobacco Use   Smoking status: Never   Smokeless tobacco: Never  Vaping Use   Vaping status: Never Used  Substance Use Topics   Alcohol use: Yes    Alcohol/week: 0.0 standard drinks of alcohol    Comment: occasional drink once a month   Drug use: No    Allergies: No Known Allergies    Current Outpatient Medications:    fluticasone (FLONASE) 50 MCG/ACT nasal spray, Place 2 sprays into both nostrils daily. (Patient not taking: Reported on 12/26/2022), Disp: 16 g, Rfl: 6   meloxicam (MOBIC) 15 MG tablet, Take 1 tablet (15 mg total) by mouth daily as needed for pain. (Patient not taking: Reported on 02/23/2023), Disp: 30 tablet, Rfl: 0   Vitamin D, Ergocalciferol, (DRISDOL) 1.25 MG (50000 UNIT) CAPS capsule, TAKE 1 CAPSULE BY MOUTH EVERY 7 DAYS (Patient not taking: Reported on 02/23/2023), Disp: 12 capsule, Rfl: 0  History of Present Illness: Here for physical.  PCP manages labs.  Had genetic counseling pre-appt phone call a few weeks ago (mom died of lung cancer 03/05/17 (smoker) and sister w/ some sort of ?intestinal? cancer).  Unsure if wants testing. Will f/u with them if wanted. BTL for contraception. Periods are regular and light.  Prior cytology:      Component Value  Date/Time   DIAGPAP  09/16/2021 0916    - Negative for intraepithelial lesion or malignancy (NILM)   HPVHIGH Negative 09/16/2021 0916   ADEQPAP  09/16/2021 0916    Satisfactory for evaluation; transformation zone component ABSENT.      Review of Systems   Patient denies any headaches, blurred vision, shortness of breath, chest pain, abdominal pain, problems with bowel movements, urination, or intercourse.   Physical Exam: General:  Well developed, well nourished, no acute distress Skin:  Warm and dry Neck:  Midline trachea Lungs; Clear to auscultation bilaterally Breast:  No dominant palpable mass, retraction, or nipple discharge Cardiovascular: Regular rate and rhythm Abdomen:  Soft, non tender, no hepatosplenomegaly Pelvic:  External genitalia is normal in appearance.  The vagina is normal in appearance. Stable thumb sized vaginal cysts (first found in March 05, 2016) on the right side of introitus. The cervix is bulbous.  Uterus is felt to be normal size, shape, and contour.  No adnexal masses or tenderness noted. Exam limited by habitus  Extremities:  No swelling or varicosities noted Psych:  No mood changes.     Impression: Normal GYN exam      Plan:pap due in 2 years.  Find out what sister's MD recommends as far as family screening (if any) d/t her cancer dx.

## 2023-03-03 ENCOUNTER — Encounter: Payer: 59 | Admitting: Genetic Counselor

## 2023-05-03 ENCOUNTER — Ambulatory Visit: Payer: 59 | Admitting: Family Medicine

## 2023-05-03 VITALS — BP 116/72 | HR 69 | Temp 97.5°F | Ht 66.0 in | Wt 301.0 lb

## 2023-05-03 DIAGNOSIS — R0989 Other specified symptoms and signs involving the circulatory and respiratory systems: Secondary | ICD-10-CM | POA: Insufficient documentation

## 2023-05-03 MED ORDER — LEVOCETIRIZINE DIHYDROCHLORIDE 5 MG PO TABS: 5.0000 mg | ORAL_TABLET | Freq: Every evening | ORAL | 0 refills | Status: AC

## 2023-05-03 MED ORDER — IPRATROPIUM BROMIDE 0.06 % NA SOLN: 2.0000 | Freq: Four times a day (QID) | NASAL | 0 refills | Status: AC | PRN

## 2023-05-03 NOTE — Assessment & Plan Note (Signed)
Secondary to postnasal drip.  Treating with Atrovent and Xyzal.  No evidence of community-acquired pneumonia which is the patient's primary concern today.

## 2023-05-03 NOTE — Progress Notes (Signed)
Subjective:  Patient ID: Terri Reynolds, female    DOB: May 23, 1986  Age: 37 y.o. MRN: [id number]  CC:   Chief Complaint  Patient presents with   throat clearing cough    For 4 days, son and coworker diagnosed w/ pnemonia    HPI:  37 year old female presents for evaluation of the above.  Patient for symptoms over the past 4 days.  Reports congestion and throat clearing.  She endorses drainage.  She states that her son has had pneumonia and she has been around a coworker who has pneumonia.  She is concerned about pneumonia.  No fever.  No significant cough.  No sore throat.  No other associated symptoms.  No other complaints.  Patient Active Problem List   Diagnosis Date Noted   Throat clearing 05/03/2023   Family history of cancer 12/26/2022   Plantar fasciitis 12/26/2022   Vitamin D deficiency 05/09/2022   Morbid obesity with BMI of 40.0-44.9, adult (HCC) 06/11/2014    Social Hx   Social History   Socioeconomic History   Marital status: Married    Spouse name: Not on file   Number of children: 2   Years of education: Not on file   Highest education level: Not on file  Occupational History   Not on file  Tobacco Use   Smoking status: Never   Smokeless tobacco: Never  Vaping Use   Vaping status: Never Used  Substance and Sexual Activity   Alcohol use: Yes    Alcohol/week: 0.0 standard drinks of alcohol    Comment: occasional drink once a month   Drug use: No   Sexual activity: Yes    Partners: Male    Birth control/protection: Surgical  Other Topics Concern   Not on file  Social History Narrative   Not on file   Social Determinants of Health   Financial Resource Strain: Low Risk  (02/23/2023)   Overall Financial Resource Strain (CARDIA)    Difficulty of Paying Living Expenses: Not very hard  Food Insecurity: No Food Insecurity (02/23/2023)   Hunger Vital Sign    Worried About Running Out of Food in the Last Year: Never true    Ran Out of Food in the Last  Year: Never true  Transportation Needs: No Transportation Needs (02/23/2023)   PRAPARE - Administrator, Civil Service (Medical): No    Lack of Transportation (Non-Medical): No  Physical Activity: Insufficiently Active (02/23/2023)   Exercise Vital Sign    Days of Exercise per Week: 1 day    Minutes of Exercise per Session: 30 min  Stress: No Stress Concern Present (02/23/2023)   [employer] of Occupational Health - Occupational Stress Questionnaire    Feeling of Stress : Not at all  Social Connections: Socially Integrated (02/23/2023)   Social Connection and Isolation Panel [NHANES]    Frequency of Communication with Friends and Family: Twice a week    Frequency of Social Gatherings with Friends and Family: Once a week    Attends Religious Services: 1 to 4 times per year    Active Member of [employer] or Organizations: Yes    Attends Banker Meetings: 1 to 4 times per year    Marital Status: Married    Review of Systems Per HPI  Objective:  BP 116/72   Pulse 69   Temp (!) 97.5 F (36.4 C)   Ht 5\' 6"  (1.676 m)   Wt (!) 301 lb (136.5 kg)   SpO2  100%   BMI 48.58 kg/m      05/03/2023    8:32 AM 02/23/2023    1:49 PM 12/26/2022    2:05 PM  BP/Weight  Systolic BP 116 128 126  Diastolic BP 72 86 87  Wt. (Lbs) 301 300 298.8  BMI 48.58 kg/m2 48.42 kg/m2 48.23 kg/m2    Physical Exam Vitals and nursing note reviewed.  Constitutional:      Appearance: Normal appearance. She is obese. She is not ill-appearing.  HENT:     Head: Normocephalic and atraumatic.     Right Ear: Tympanic membrane normal.     Left Ear: Tympanic membrane normal.     Mouth/Throat:     Pharynx: Oropharynx is clear.  Cardiovascular:     Rate and Rhythm: Normal rate and regular rhythm.  Pulmonary:     Effort: Pulmonary effort is normal.     Breath sounds: Normal breath sounds. No wheezing, rhonchi or rales.  Neurological:     Mental Status: She is alert.     Lab Results   Component Value Date   WBC 9.0 09/16/2021   HGB 11.2 09/16/2021   HCT 35.8 09/16/2021   PLT 341 09/16/2021   GLUCOSE 82 09/16/2021   CHOL 163 09/16/2021   TRIG 94 09/16/2021   HDL 49 09/16/2021   LDLCALC 97 09/16/2021   ALT 13 09/16/2021   AST 16 09/16/2021   NA 137 09/16/2021   K 4.2 09/16/2021   CL 104 09/16/2021   CREATININE 0.85 09/16/2021   BUN 4 (L) 09/16/2021   CO2 22 09/16/2021   TSH 1.241 06/19/2014   HGBA1C 4.9 09/16/2021     Assessment & Plan:   Problem List Items Addressed This Visit       Other   Throat clearing - Primary    Secondary to postnasal drip.  Treating with Atrovent and Xyzal.  No evidence of community-acquired pneumonia which is the patient's primary concern today.       Meds ordered this encounter  Medications   levocetirizine (XYZAL) 5 MG tablet    Sig: Take 1 tablet (5 mg total) by mouth every evening.    Dispense:  30 tablet    Refill:  0   ipratropium (ATROVENT) 0.06 % nasal spray    Sig: Place 2 sprays into both nostrils 4 (four) times daily as needed for rhinitis.    Dispense:  15 mL    Refill:  0    Follow-up:  Return if symptoms worsen or fail to improve.  Everlene Other DO [facility]

## 2023-05-03 NOTE — Patient Instructions (Signed)
Lots of fluids.  Medication as prescribed.  No evidence of pneumonia.  Take care  Dr. Adriana Simas

## 2024-03-18 ENCOUNTER — Ambulatory Visit: Payer: Self-pay | Admitting: Physician Assistant

## 2024-05-09 ENCOUNTER — Encounter: Admitting: Family Medicine

## 2024-05-16 ENCOUNTER — Encounter: Admitting: Family Medicine

## 2024-05-21 ENCOUNTER — Encounter: Admitting: Family Medicine

## 2024-05-28 ENCOUNTER — Other Ambulatory Visit (HOSPITAL_COMMUNITY): Payer: Self-pay

## 2024-05-28 ENCOUNTER — Telehealth: Payer: Self-pay | Admitting: Pharmacy Technician

## 2024-05-28 ENCOUNTER — Encounter: Admitting: Family Medicine

## 2024-05-28 VITALS — BP 133/89 | HR 77 | Temp 98.7°F | Ht 66.0 in | Wt 301.2 lb

## 2024-05-28 DIAGNOSIS — Z130 Encounter for screening for diseases of the blood and blood-forming organs and certain disorders involving the immune mechanism: Secondary | ICD-10-CM | POA: Diagnosis not present

## 2024-05-28 DIAGNOSIS — Z1159 Encounter for screening for other viral diseases: Secondary | ICD-10-CM | POA: Diagnosis not present

## 2024-05-28 DIAGNOSIS — Z114 Encounter for screening for human immunodeficiency virus [HIV]: Secondary | ICD-10-CM | POA: Diagnosis not present

## 2024-05-28 DIAGNOSIS — Z23 Encounter for immunization: Secondary | ICD-10-CM | POA: Diagnosis not present

## 2024-05-28 DIAGNOSIS — E559 Vitamin D deficiency, unspecified: Secondary | ICD-10-CM

## 2024-05-28 DIAGNOSIS — Z6841 Body Mass Index (BMI) 40.0 and over, adult: Secondary | ICD-10-CM

## 2024-05-28 DIAGNOSIS — Z13220 Encounter for screening for lipoid disorders: Secondary | ICD-10-CM

## 2024-05-28 DIAGNOSIS — Z0001 Encounter for general adult medical examination with abnormal findings: Secondary | ICD-10-CM | POA: Diagnosis not present

## 2024-05-28 DIAGNOSIS — Z0000 Encounter for general adult medical examination without abnormal findings: Secondary | ICD-10-CM | POA: Insufficient documentation

## 2024-05-28 MED ORDER — ZEPBOUND 2.5 MG/0.5ML ~~LOC~~ SOAJ: 2.5000 mg | SUBCUTANEOUS | 0 refills | Status: AC

## 2024-05-28 NOTE — Telephone Encounter (Signed)
 Pharmacy Patient Advocate Encounter   Received notification from Onbase that prior authorization for Zepbound 2.5MG /0.5ML pen-injectors is required/requested.   Insurance verification completed.   The patient is insured through [employer].   Per test claim: PA required; PA submitted to above mentioned insurance via Latent Key/confirmation #/[id number] Status is pending

## 2024-05-28 NOTE — Assessment & Plan Note (Signed)
 Tetanus given today.  Declines influenza vaccine.  Labs today including HIV and hepatitis C screening.  We discussed weight loss medication.  Patient would like to proceed with Zepbound.  Rx sent in.  Unsure if insurance will cover.

## 2024-05-28 NOTE — Patient Instructions (Addendum)
 Medication sent in.   Labs ordered.  Take a look at [employer] Zepbound cost and [employer] cost online (if insurance doesn't cover).  Follow up annually  Take care  Dr. Bluford

## 2024-05-28 NOTE — Progress Notes (Signed)
 "  Subjective:  Patient ID: Terri Reynolds, female    DOB: [birth year]  Age: 38 y.o. MRN: [id number]  CC:   Chief Complaint  Patient presents with   Annual Exam    Patient is here for a physical  Patient is wanting weight loss recommendations    HPI:  38 year old female presents for annual exam.  Patient is feeling well.  She has no complaints at this time.  She is concerned about her weight.  She would like to discuss weight loss medication today.  We have previously discussed options.  Will revisit again today.  Patient requesting routine labs.  Declines flu vaccine.  He is amenable to Tdap.  He is amenable to HIV and hepatitis C screening.  Patient Active Problem List   Diagnosis Date Noted   Morbid obesity (HCC) 05/28/2024   Annual physical exam 05/28/2024   Family history of cancer 12/26/2022   Vitamin D  deficiency 05/09/2022    Social Hx   Social History   Socioeconomic History   Marital status: Married    Spouse name: Not on file   Number of children: 2   Years of education: Not on file   Highest education level: Not on file  Occupational History   Not on file  Tobacco Use   Smoking status: Never   Smokeless tobacco: Never  Vaping Use   Vaping status: Never Used  Substance and Sexual Activity   Alcohol use: Yes    Alcohol/week: 0.0 standard drinks of alcohol    Comment: occasional drink once a month   Drug use: No   Sexual activity: Yes    Partners: Male    Birth control/protection: Surgical  Other Topics Concern   Not on file  Social History Narrative   Not on file   Social Drivers of Health   Tobacco Use: Low Risk (02/23/2023)   Patient History    Smoking Tobacco Use: Never    Smokeless Tobacco Use: Never    Passive Exposure: Not on file  Financial Resource Strain: Low Risk (02/23/2023)   Overall Financial Resource Strain (CARDIA)    Difficulty of Paying Living Expenses: Not very hard  Food Insecurity: No Food Insecurity (02/23/2023)   Hunger  Vital Sign    Worried About Running Out of Food in the Last Year: Never true    Ran Out of Food in the Last Year: Never true  Transportation Needs: No Transportation Needs (02/23/2023)   PRAPARE - Administrator, Civil Service (Medical): No    Lack of Transportation (Non-Medical): No  Physical Activity: Insufficiently Active (02/23/2023)   Exercise Vital Sign    Days of Exercise per Week: 1 day    Minutes of Exercise per Session: 30 min  Stress: No Stress Concern Present (02/23/2023)   [employer] of Occupational Health - Occupational Stress Questionnaire    Feeling of Stress : Not at all  Social Connections: Socially Integrated (02/23/2023)   Social Connection and Isolation Panel    Frequency of Communication with Friends and Family: Twice a week    Frequency of Social Gatherings with Friends and Family: Once a week    Attends Religious Services: 1 to 4 times per year    Active Member of Clubs or Organizations: Yes    Attends Banker Meetings: 1 to 4 times per year    Marital Status: Married  Depression (PHQ2-9): Low Risk (05/28/2024)   Depression (PHQ2-9)    PHQ-2 Score: 2  Alcohol  Screen: Low Risk (02/23/2023)   Alcohol Screen    Last Alcohol Screening Score (AUDIT): 1  Housing: Low Risk (02/23/2023)   Housing    Last Housing Risk Score: 0  Utilities: Not At Risk (02/23/2023)   AHC Utilities    Threatened with loss of utilities: No  Health Literacy: Not on file    Review of Systems  Constitutional: Negative.   Respiratory: Negative.    Cardiovascular: Negative.    Objective:  BP 133/89 (BP Location: Left Arm, Patient Position: Sitting)   Pulse 77   Temp 98.7 F (37.1 C)   Ht 5' 6 (1.676 m)   Wt (!) 301 lb 4 oz (136.6 kg)   SpO2 99%   BMI 48.62 kg/m      05/28/2024    8:57 AM 05/03/2023    8:32 AM 02/23/2023    1:49 PM  BP/Weight  Systolic BP 133 116 128  Diastolic BP 89 72 86  Wt. (Lbs) 301.25 301 300  BMI 48.62 kg/m2 48.58 kg/m2  48.42 kg/m2    Physical Exam Vitals and nursing note reviewed.  Constitutional:      General: She is not in acute distress.    Appearance: Normal appearance. She is obese.  HENT:     Head: Normocephalic and atraumatic.  Eyes:     General:        Right eye: No discharge.        Left eye: No discharge.     Conjunctiva/sclera: Conjunctivae normal.  Cardiovascular:     Rate and Rhythm: Normal rate and regular rhythm.  Pulmonary:     Effort: Pulmonary effort is normal.     Breath sounds: Normal breath sounds. No wheezing, rhonchi or rales.  Neurological:     Mental Status: She is alert.  Psychiatric:        Mood and Affect: Mood normal.        Behavior: Behavior normal.     Lab Results  Component Value Date   WBC 9.0 09/16/2021   HGB 11.2 09/16/2021   HCT 35.8 09/16/2021   PLT 341 09/16/2021   GLUCOSE 82 09/16/2021   CHOL 163 09/16/2021   TRIG 94 09/16/2021   HDL 49 09/16/2021   LDLCALC 97 09/16/2021   ALT 13 09/16/2021   AST 16 09/16/2021   NA 137 09/16/2021   K 4.2 09/16/2021   CL 104 09/16/2021   CREATININE 0.85 09/16/2021   BUN 4 (L) 09/16/2021   CO2 22 09/16/2021   TSH 1.241 06/19/2014   HGBA1C 4.9 09/16/2021     Assessment & Plan:  Annual physical exam Assessment & Plan: Tetanus given today.  Declines influenza vaccine.  Labs today including HIV and hepatitis C screening.  We discussed weight loss medication.  Patient would like to proceed with Zepbound.  Rx sent in.  Unsure if insurance will cover.   Vitamin D  deficiency -     VITAMIN D  25 Hydroxy (Vit-D Deficiency, Fractures)  Screening for lipid disorders -     Lipid panel  Morbid obesity (HCC) -     CMP14+EGFR -     Hemoglobin A1c -     Zepbound; Inject 2.5 mg into the skin once a week.  Dispense: 2 mL; Refill: 0  Screening for deficiency anemia -     CBC  Immunization due -     Tdap vaccine greater than or equal to 7yo IM  Screening for HIV (human immunodeficiency virus) -     HIV  Antibody (routine testing w rflx)  Encounter for hepatitis C screening test for low risk patient -     Hepatitis C antibody    Follow-up:  Annually  Jacqulyn Ahle DO [facility] "

## 2024-05-29 ENCOUNTER — Other Ambulatory Visit (HOSPITAL_COMMUNITY): Payer: Self-pay

## 2024-05-29 LAB — CMP14+EGFR
ALT: 14 IU/L (ref 0–32)
AST: 14 IU/L (ref 0–40)
Albumin: 3.8 g/dL — ABNORMAL LOW (ref 3.9–4.9)
Alkaline Phosphatase: 71 IU/L (ref 41–116)
BUN/Creatinine Ratio: 6 — ABNORMAL LOW (ref 9–23)
BUN: 5 mg/dL — ABNORMAL LOW (ref 6–20)
Bilirubin Total: 0.8 mg/dL (ref 0.0–1.2)
CO2: 20 mmol/L (ref 20–29)
Calcium: 8.9 mg/dL (ref 8.7–10.2)
Chloride: 106 mmol/L (ref 96–106)
Creatinine, Ser: 0.84 mg/dL (ref 0.57–1.00)
Globulin, Total: 3 g/dL (ref 1.5–4.5)
Glucose: 82 mg/dL (ref 70–99)
Potassium: 4.3 mmol/L (ref 3.5–5.2)
Sodium: 139 mmol/L (ref 134–144)
Total Protein: 6.8 g/dL (ref 6.0–8.5)
eGFR: 91 mL/min/1.73

## 2024-05-29 LAB — VITAMIN D 25 HYDROXY (VIT D DEFICIENCY, FRACTURES): Vit D, 25-Hydroxy: 17 ng/mL — ABNORMAL LOW (ref 30.0–100.0)

## 2024-05-29 LAB — CBC
Hematocrit: 37.4 % (ref 34.0–46.6)
Hemoglobin: 11.1 g/dL (ref 11.1–15.9)
MCH: 25.4 pg — ABNORMAL LOW (ref 26.6–33.0)
MCHC: 29.7 g/dL — ABNORMAL LOW (ref 31.5–35.7)
MCV: 86 fL (ref 79–97)
Platelets: 377 x10E3/uL (ref 150–450)
RBC: 4.37 x10E6/uL (ref 3.77–5.28)
RDW: 14.9 % (ref 11.7–15.4)
WBC: 8 x10E3/uL (ref 3.4–10.8)

## 2024-05-29 LAB — LIPID PANEL
Chol/HDL Ratio: 3.4 ratio (ref 0.0–4.4)
Cholesterol, Total: 150 mg/dL (ref 100–199)
HDL: 44 mg/dL
LDL Chol Calc (NIH): 84 mg/dL (ref 0–99)
Triglycerides: 120 mg/dL (ref 0–149)
VLDL Cholesterol Cal: 22 mg/dL (ref 5–40)

## 2024-05-29 LAB — HIV ANTIBODY (ROUTINE TESTING W REFLEX): HIV Screen 4th Generation wRfx: NONREACTIVE

## 2024-05-29 LAB — HEPATITIS C ANTIBODY: Hep C Virus Ab: NONREACTIVE

## 2024-05-29 LAB — HEMOGLOBIN A1C
Est. average glucose Bld gHb Est-mCnc: 91 mg/dL
Hgb A1c MFr Bld: 4.8 % (ref 4.8–5.6)

## 2024-05-29 NOTE — Telephone Encounter (Signed)
 Pharmacy Patient Advocate Encounter  Received notification from [facility] that Prior Authorization for Zepbound 2.5MG /0.5ML pen-injectors has been APPROVED from 04/28/2024 to 01/23/2025. Unable to obtain price due to refill too soon rejection, last fill date 05/28/2024 next available fill date01/19/2026.   PA #/Case ID/Reference #: [id number]

## 2024-05-30 ENCOUNTER — Ambulatory Visit: Payer: Self-pay | Admitting: Family Medicine

## 2024-05-30 ENCOUNTER — Other Ambulatory Visit: Payer: Self-pay | Admitting: Family Medicine

## 2024-05-30 MED ORDER — VITAMIN D (ERGOCALCIFEROL) 1.25 MG (50000 UNIT) PO CAPS: 50000.0000 [IU] | ORAL_CAPSULE | ORAL | 0 refills | Status: AC
